# Patient Record
Sex: Female | Born: 1945 | Race: White | Hispanic: No | Smoking: Former smoker
Health system: Southern US, Community
[De-identification: ages and names within clinical notes are randomized; demographics above are authoritative.]

## PROBLEM LIST (undated history)

## (undated) DIAGNOSIS — R112 Nausea with vomiting, unspecified: Secondary | ICD-10-CM

## (undated) DIAGNOSIS — Z9889 Other specified postprocedural states: Secondary | ICD-10-CM

## (undated) DIAGNOSIS — I2510 Atherosclerotic heart disease of native coronary artery without angina pectoris: Secondary | ICD-10-CM

## (undated) DIAGNOSIS — W19XXXA Unspecified fall, initial encounter: Secondary | ICD-10-CM

## (undated) DIAGNOSIS — M1990 Unspecified osteoarthritis, unspecified site: Secondary | ICD-10-CM

## (undated) DIAGNOSIS — D219 Benign neoplasm of connective and other soft tissue, unspecified: Secondary | ICD-10-CM

## (undated) HISTORY — DX: Atherosclerotic heart disease of native coronary artery without angina pectoris: I25.10

## (undated) HISTORY — PX: TONSILLECTOMY: SUR1361

## (undated) HISTORY — PX: TUBAL LIGATION: SHX77

## (undated) HISTORY — DX: Benign neoplasm of connective and other soft tissue, unspecified: D21.9

## (undated) HISTORY — PX: ANAL FISSURE REPAIR: SHX2312

## (undated) HISTORY — DX: Unspecified fall, initial encounter: W19.XXXA

---

## 1987-11-12 HISTORY — PX: BREAST ENHANCEMENT SURGERY: SHX7

## 1993-11-11 DIAGNOSIS — D219 Benign neoplasm of connective and other soft tissue, unspecified: Secondary | ICD-10-CM

## 1993-11-11 HISTORY — DX: Benign neoplasm of connective and other soft tissue, unspecified: D21.9

## 1999-04-24 ENCOUNTER — Other Ambulatory Visit: Admission: RE | Admit: 1999-04-24 | Discharge: 1999-04-24 | Payer: Self-pay | Admitting: Gynecology

## 2000-07-16 ENCOUNTER — Other Ambulatory Visit: Admission: RE | Admit: 2000-07-16 | Discharge: 2000-07-16 | Payer: Self-pay | Admitting: Obstetrics and Gynecology

## 2001-10-20 ENCOUNTER — Other Ambulatory Visit: Admission: RE | Admit: 2001-10-20 | Discharge: 2001-10-20 | Payer: Self-pay | Admitting: Obstetrics and Gynecology

## 2002-12-01 ENCOUNTER — Other Ambulatory Visit: Admission: RE | Admit: 2002-12-01 | Discharge: 2002-12-01 | Payer: Self-pay | Admitting: Obstetrics and Gynecology

## 2004-02-27 ENCOUNTER — Other Ambulatory Visit: Admission: RE | Admit: 2004-02-27 | Discharge: 2004-02-27 | Payer: Self-pay | Admitting: Obstetrics and Gynecology

## 2004-04-19 ENCOUNTER — Ambulatory Visit (HOSPITAL_COMMUNITY): Admission: RE | Admit: 2004-04-19 | Discharge: 2004-04-19 | Payer: Self-pay | Admitting: Gastroenterology

## 2005-06-25 ENCOUNTER — Other Ambulatory Visit: Admission: RE | Admit: 2005-06-25 | Discharge: 2005-06-25 | Payer: Self-pay | Admitting: Obstetrics and Gynecology

## 2006-08-22 ENCOUNTER — Other Ambulatory Visit: Admission: RE | Admit: 2006-08-22 | Discharge: 2006-08-22 | Payer: Self-pay | Admitting: Obstetrics and Gynecology

## 2007-10-12 ENCOUNTER — Other Ambulatory Visit: Admission: RE | Admit: 2007-10-12 | Discharge: 2007-10-12 | Payer: Self-pay | Admitting: Obstetrics and Gynecology

## 2011-03-29 NOTE — Op Note (Signed)
NAME:  Glenda Hunt, Glenda Hunt                            ACCOUNT NO.:  [id number]   MEDICAL RECORD NO.:  [id number]                   PATIENT TYPE:  AMB   LOCATION:  ENDO                                 FACILITY:  [facility]   PHYSICIAN:  John C. Madilyn Fireman, M.D.                 DATE OF BIRTH:  1946/11/03   DATE OF PROCEDURE:  04/19/2004  DATE OF DISCHARGE:                                 OPERATIVE REPORT   PROCEDURE:  Colonoscopy.   INDICATIONS FOR PROCEDURE:  Screening colonoscopy.   DESCRIPTION OF PROCEDURE:  The patient was placed in the left lateral  decubitus position and placed on the pulse monitor with continuous low-flow  oxygen delivered by nasal cannula.  She was sedated with 75 mcg IV fentanyl  and 8 mg IV Versed.  The Olympus video colonoscope was inserted into the  rectum and advanced to the cecum, confirmed by transillumination of  McBurney's point and visualization of the ileocecal valve and appendiceal  orifice.  Prep was excellent.  The cecum, ascending, transverse, descending,  and sigmoid colon all appeared normal with no masses, polyps, diverticula,  or other mucosal abnormalities.  The rectum likewise appeared normal and  retroflexed view of the anus revealed no obvious internal hemorrhoids.  The  scope was then withdrawn and the patient returned to the recovery room in  stable condition.  She tolerated the procedure well and there were no  immediate complications.   IMPRESSION:  Normal colonoscopy.   PLAN:  The next colon screening by sigmoidoscopy in five years.  __________.                                               John C. Madilyn Fireman, M.D.    JCH/MEDQ  D:  04/19/2004  T:  04/19/2004  Job:  [id number]

## 2012-12-03 ENCOUNTER — Other Ambulatory Visit: Payer: Self-pay | Admitting: Orthopedic Surgery

## 2012-12-03 DIAGNOSIS — M25511 Pain in right shoulder: Secondary | ICD-10-CM

## 2012-12-06 ENCOUNTER — Ambulatory Visit
Admission: RE | Admit: 2012-12-06 | Discharge: 2012-12-06 | Disposition: A | Discharge status: Home / Self Care | Payer: Medicare Other | Source: Ambulatory Visit | Attending: Orthopedic Surgery | Admitting: Orthopedic Surgery

## 2012-12-06 DIAGNOSIS — M25511 Pain in right shoulder: Secondary | ICD-10-CM

## 2012-12-24 NOTE — H&P (Signed)
  [facility] [address]   [address] [place], [place] [address] [phone] A Division of [facility]  Loreta Ave, M.D.   Cheick Suhr A. Thurston Hole, M.D.   Burnell Blanks, M.D.   Eulas Post, M.D.   Lunette Stands, M.D Buford Dresser, M.D.  Charlsie Quest, M.D.   Estell Harpin, M.D.   Melina Fiddler, M.D. Genene Churn. Barry Dienes, PA-C            Kirstin A. Shepperson, PA-C Josh Rockwell, PA-C [place], [place]   RE: [place], [place]                                [id number]      DOB: 1946/07/09 PROGRESS NOTE: 12-03-12 Ms. Easton is a 67 year-old woman seen for follow up evaluation for recurrent right shoulder pain.  We had injected her shoulder in August, but now the pain has recurred.  Pain with overhead use and activity, relieved by rest.  Night pain as well.  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   Past medical, social and family history I have reviewed in detail.  Review of systems: All other systems are reviewed and are negative.  Current medications: Ambien and Vitamin D.  Allergies: Penicillin.  EXAMINATION: Well-developed, well-nourished white female in no acute distress.  Alert and oriented.  Height: 5?7.  Weight: 155 pounds.  Respirations: 12 and unlabored.  Examination of her right shoulder reveals forward flexion of 170 with pain and mild weakness.  Abduction of 170 with pain and mild weakness.  Internal and external rotation of 80 degrees with pain and mild weakness.  No instability.  Examination of her left shoulder reveals full range of motion without pain, weakness or instability.  Vascular exam: Pulses are 2+ and symmetric.    IMPRESSION: 1. Right shoulder possible rotator cuff tear.   2. Cervical left rhomboid radiculopathy.   PLAN: We will  obtain an MRI of the right shoulder to due to failed conservative care.  We will contact her afterwards to go over the results and determine further definitive treatment at that time.  Her cell phone is [phone] and her home number is [phone].  Cartha Rotert A. Thurston Hole, M.D.   Electronically verified by Elana Alm. Thurston Hole, M.D. RAW:jjh Cc: Dr. Maurice Small Fax: [phone] D 12-03-12 T 12-04-12

## 2012-12-25 ENCOUNTER — Ambulatory Visit (HOSPITAL_BASED_OUTPATIENT_CLINIC_OR_DEPARTMENT_OTHER): Payer: Medicare Other | Admitting: Certified Registered"

## 2012-12-25 ENCOUNTER — Encounter (HOSPITAL_BASED_OUTPATIENT_CLINIC_OR_DEPARTMENT_OTHER): Payer: Self-pay | Admitting: Certified Registered"

## 2012-12-25 ENCOUNTER — Ambulatory Visit (HOSPITAL_BASED_OUTPATIENT_CLINIC_OR_DEPARTMENT_OTHER)
Admission: RE | Admit: 2012-12-25 | Discharge: 2012-12-25 | Disposition: A | Discharge status: Home / Self Care | Payer: Medicare Other | Source: Ambulatory Visit | Attending: Orthopedic Surgery | Admitting: Orthopedic Surgery

## 2012-12-25 ENCOUNTER — Encounter (HOSPITAL_BASED_OUTPATIENT_CLINIC_OR_DEPARTMENT_OTHER): Payer: Self-pay | Admitting: *Deleted

## 2012-12-25 ENCOUNTER — Encounter (HOSPITAL_BASED_OUTPATIENT_CLINIC_OR_DEPARTMENT_OTHER)
Admission: RE | Disposition: A | Discharge status: Home / Self Care | Payer: Self-pay | Source: Ambulatory Visit | Attending: Orthopedic Surgery

## 2012-12-25 DIAGNOSIS — M25819 Other specified joint disorders, unspecified shoulder: Secondary | ICD-10-CM | POA: Insufficient documentation

## 2012-12-25 DIAGNOSIS — M719 Bursopathy, unspecified: Secondary | ICD-10-CM | POA: Insufficient documentation

## 2012-12-25 DIAGNOSIS — M7541 Impingement syndrome of right shoulder: Secondary | ICD-10-CM | POA: Diagnosis present

## 2012-12-25 DIAGNOSIS — M24119 Other articular cartilage disorders, unspecified shoulder: Secondary | ICD-10-CM | POA: Insufficient documentation

## 2012-12-25 DIAGNOSIS — Z9889 Other specified postprocedural states: Secondary | ICD-10-CM | POA: Diagnosis not present

## 2012-12-25 DIAGNOSIS — R112 Nausea with vomiting, unspecified: Secondary | ICD-10-CM | POA: Diagnosis not present

## 2012-12-25 DIAGNOSIS — M67919 Unspecified disorder of synovium and tendon, unspecified shoulder: Secondary | ICD-10-CM | POA: Insufficient documentation

## 2012-12-25 DIAGNOSIS — M19019 Primary osteoarthritis, unspecified shoulder: Secondary | ICD-10-CM | POA: Diagnosis present

## 2012-12-25 DIAGNOSIS — M75100 Unspecified rotator cuff tear or rupture of unspecified shoulder, not specified as traumatic: Secondary | ICD-10-CM | POA: Diagnosis present

## 2012-12-25 HISTORY — PX: SHOULDER ARTHROSCOPY WITH ROTATOR CUFF REPAIR: SHX5685

## 2012-12-25 HISTORY — DX: Unspecified osteoarthritis, unspecified site: M19.90

## 2012-12-25 HISTORY — DX: Nausea with vomiting, unspecified: R11.2

## 2012-12-25 HISTORY — DX: Other specified postprocedural states: Z98.890

## 2012-12-25 LAB — POCT HEMOGLOBIN-HEMACUE: Hemoglobin: 13.2 g/dL (ref 12.0–15.0)

## 2012-12-25 SURGERY — ARTHROSCOPY, SHOULDER, WITH ROTATOR CUFF REPAIR
Anesthesia: Regional | Site: Shoulder | Laterality: Right | Wound class: Clean

## 2012-12-25 MED ORDER — MIDAZOLAM HCL 2 MG/2ML IJ SOLN: 1.0000 mg | INTRAMUSCULAR | Status: DC | PRN

## 2012-12-25 MED ORDER — FENTANYL CITRATE 0.05 MG/ML IJ SOLN: 50.0000 ug | INTRAMUSCULAR | Status: DC | PRN

## 2012-12-25 MED ORDER — BUPIVACAINE-EPINEPHRINE PF 0.5-1:200000 % IJ SOLN
INTRAMUSCULAR | Status: DC | PRN
  Administered 2012-12-25: 30 mL

## 2012-12-25 MED ORDER — FENTANYL CITRATE 0.05 MG/ML IJ SOLN
50.0000 ug | INTRAMUSCULAR | Status: DC | PRN
  Administered 2012-12-25: 100 ug via INTRAVENOUS

## 2012-12-25 MED ORDER — VANCOMYCIN HCL 1000 MG IV SOLR
1000.0000 mg | INTRAVENOUS | Status: DC | PRN
  Administered 2012-12-25: 1000 mg via INTRAVENOUS

## 2012-12-25 MED ORDER — PROPOFOL 10 MG/ML IV BOLUS
INTRAVENOUS | Status: DC | PRN
  Administered 2012-12-25: 100 mg via INTRAVENOUS

## 2012-12-25 MED ORDER — SUCCINYLCHOLINE CHLORIDE 20 MG/ML IJ SOLN
INTRAMUSCULAR | Status: DC | PRN
  Administered 2012-12-25: 80 mg via INTRAVENOUS

## 2012-12-25 MED ORDER — DIAZEPAM 2 MG PO TABS: 1.0000 mg | ORAL_TABLET | Freq: Three times a day (TID) | ORAL | Status: DC | PRN

## 2012-12-25 MED ORDER — PROMETHAZINE HCL 25 MG/ML IJ SOLN
6.2500 mg | INTRAMUSCULAR | Status: AC | PRN
  Administered 2012-12-25 (×2): 6.25 mg via INTRAVENOUS

## 2012-12-25 MED ORDER — ONDANSETRON 4 MG PO TBDP: 4.0000 mg | ORAL_TABLET | Freq: Three times a day (TID) | ORAL | Status: DC | PRN

## 2012-12-25 MED ORDER — TRAMADOL HCL 50 MG PO TABS: 50.0000 mg | ORAL_TABLET | Freq: Four times a day (QID) | ORAL | Status: DC | PRN

## 2012-12-25 MED ORDER — EPINEPHRINE HCL 1 MG/ML IJ SOLN
INTRAMUSCULAR | Status: DC | PRN
  Administered 2012-12-25 (×2)

## 2012-12-25 MED ORDER — LACTATED RINGERS IV SOLN
INTRAVENOUS | Status: DC
  Administered 2012-12-25 (×3): via INTRAVENOUS

## 2012-12-25 MED ORDER — MIDAZOLAM HCL 2 MG/2ML IJ SOLN
1.0000 mg | INTRAMUSCULAR | Status: DC | PRN
  Administered 2012-12-25: 2 mg via INTRAVENOUS

## 2012-12-25 MED ORDER — OXYCODONE HCL 5 MG PO TABS: 5.0000 mg | ORAL_TABLET | Freq: Once | ORAL | Status: DC | PRN

## 2012-12-25 MED ORDER — HYDROMORPHONE HCL PF 1 MG/ML IJ SOLN: 0.2500 mg | INTRAMUSCULAR | Status: DC | PRN

## 2012-12-25 MED ORDER — DEXAMETHASONE SODIUM PHOSPHATE 4 MG/ML IJ SOLN: INTRAMUSCULAR | Status: DC | PRN

## 2012-12-25 MED ORDER — OXYCODONE HCL 5 MG/5ML PO SOLN: 5.0000 mg | Freq: Once | ORAL | Status: DC | PRN

## 2012-12-25 MED ORDER — DEXAMETHASONE SODIUM PHOSPHATE 4 MG/ML IJ SOLN
INTRAMUSCULAR | Status: DC | PRN
  Administered 2012-12-25: 10 mg via INTRAVENOUS

## 2012-12-25 SURGICAL SUPPLY — 77 items
APL SKNCLS STERI-STRIP NONHPOA (GAUZE/BANDAGES/DRESSINGS)
BENZOIN TINCTURE PRP APPL 2/3 (GAUZE/BANDAGES/DRESSINGS) IMPLANT
BLADE CUDA 5.5 (BLADE) IMPLANT
BLADE CUTTER GATOR 3.5 (BLADE) ×2 IMPLANT
BLADE GREAT WHITE 4.2 (BLADE) IMPLANT
BLADE SURG 15 STRL LF DISP TIS (BLADE) IMPLANT
BLADE SURG 15 STRL SS (BLADE)
BLADE SURG ROTATE 9660 (MISCELLANEOUS) IMPLANT
BNDG COHESIVE 4X5 TAN STRL (GAUZE/BANDAGES/DRESSINGS) ×1 IMPLANT
BUR OVAL 6.0 (BURR) ×2 IMPLANT
CANISTER OMNI JUG 16 LITER (MISCELLANEOUS) ×1 IMPLANT
CANISTER SUCTION 2500CC (MISCELLANEOUS) IMPLANT
CANNULA TWIST IN 8.25X7CM (CANNULA) ×1 IMPLANT
DECANTER SPIKE VIAL GLASS SM (MISCELLANEOUS) IMPLANT
DRAPE SHOULDER BEACH CHAIR (DRAPES) ×2 IMPLANT
DRAPE U-SHAPE 47X51 STRL (DRAPES) ×4 IMPLANT
DRAPE UTILITY W/TAPE 26X15 (DRAPES) ×1 IMPLANT
DRSG PAD ABDOMINAL 8X10 ST (GAUZE/BANDAGES/DRESSINGS) ×2 IMPLANT
DURAPREP 26ML APPLICATOR (WOUND CARE) ×2 IMPLANT
ELECT REM PT RETURN 9FT ADLT (ELECTROSURGICAL)
ELECTRODE REM PT RTRN 9FT ADLT (ELECTROSURGICAL) ×1 IMPLANT
GAUZE XEROFORM 1X8 LF (GAUZE/BANDAGES/DRESSINGS) ×2 IMPLANT
GLOVE BIO SURGEON STRL SZ 6.5 (GLOVE) ×2 IMPLANT
GLOVE BIO SURGEON STRL SZ7 (GLOVE) IMPLANT
GLOVE BIOGEL PI IND STRL 7.0 (GLOVE) ×2 IMPLANT
GLOVE BIOGEL PI IND STRL 7.5 (GLOVE) ×1 IMPLANT
GLOVE BIOGEL PI INDICATOR 7.0 (GLOVE) ×2
GLOVE BIOGEL PI INDICATOR 7.5 (GLOVE) ×1
GLOVE SS BIOGEL STRL SZ 7.5 (GLOVE) ×1 IMPLANT
GLOVE SUPERSENSE BIOGEL SZ 7.5 (GLOVE) ×1
GOWN PREVENTION PLUS XLARGE (GOWN DISPOSABLE) ×3 IMPLANT
GOWN PREVENTION PLUS XXLARGE (GOWN DISPOSABLE) ×1 IMPLANT
NDL 1/2 CIR CATGUT .05X1.09 (NEEDLE) IMPLANT
NDL SAFETY ECLIPSE 18X1.5 (NEEDLE) ×1 IMPLANT
NDL SCORPION MULTI FIRE (NEEDLE) IMPLANT
NDL SUT 6 .5 CRC .975X.05 MAYO (NEEDLE) IMPLANT
NEEDLE 1/2 CIR CATGUT .05X1.09 (NEEDLE) IMPLANT
NEEDLE HYPO 18GX1.5 SHARP (NEEDLE) ×2
NEEDLE MAYO TAPER (NEEDLE)
NEEDLE SCORPION MULTI FIRE (NEEDLE) ×2 IMPLANT
PACK ARTHROSCOPY DSU (CUSTOM PROCEDURE TRAY) ×2 IMPLANT
PACK BASIN DAY SURGERY FS (CUSTOM PROCEDURE TRAY) ×2 IMPLANT
PAD ALCOHOL SWAB (MISCELLANEOUS) ×2 IMPLANT
PENCIL BUTTON HOLSTER BLD 10FT (ELECTRODE) IMPLANT
SET ARTHROSCOPY TUBING (MISCELLANEOUS) ×2
SET ARTHROSCOPY TUBING LN (MISCELLANEOUS) ×1 IMPLANT
SHEET MEDIUM DRAPE 40X70 STRL (DRAPES) IMPLANT
SLEEVE SCD COMPRESS KNEE MED (MISCELLANEOUS) ×2 IMPLANT
SLING ARM FOAM STRAP LRG (SOFTGOODS) IMPLANT
SLING ARM FOAM STRAP MED (SOFTGOODS) IMPLANT
SLING ARM FOAM STRAP XLG (SOFTGOODS) IMPLANT
SLING ARM IMMOBILIZER MED (SOFTGOODS) IMPLANT
SLING ULTRA III MED (ORTHOPEDIC SUPPLIES) IMPLANT
SPONGE GAUZE 4X4 12PLY (GAUZE/BANDAGES/DRESSINGS) ×2 IMPLANT
SPONGE LAP 4X18 X RAY DECT (DISPOSABLE) IMPLANT
STRIP CLOSURE SKIN 1/2X4 (GAUZE/BANDAGES/DRESSINGS) IMPLANT
SUCTION FRAZIER TIP 10 FR DISP (SUCTIONS) IMPLANT
SUT ETHILON 3 0 PS 1 (SUTURE) ×2 IMPLANT
SUT FIBERWIRE #2 38 T-5 BLUE (SUTURE) ×2
SUT PDS AB 2-0 CT2 27 (SUTURE) IMPLANT
SUT PROLENE 3 0 PS 2 (SUTURE) IMPLANT
SUT TIGER TAPE 7 IN WHITE (SUTURE) IMPLANT
SUT VIC AB 0 SH 27 (SUTURE) IMPLANT
SUT VIC AB 2-0 PS2 27 (SUTURE) IMPLANT
SUT VIC AB 2-0 SH 27 (SUTURE)
SUT VIC AB 2-0 SH 27XBRD (SUTURE) IMPLANT
SUTURE FIBERWR #2 38 T-5 BLUE (SUTURE) IMPLANT
SYR 20CC LL (SYRINGE) IMPLANT
SYR 5ML LL (SYRINGE) ×1 IMPLANT
SYR BULB 3OZ (MISCELLANEOUS) IMPLANT
TAPE FIBER 2MM 7IN #2 BLUE (SUTURE) IMPLANT
TAPE HYPAFIX 6X30 (GAUZE/BANDAGES/DRESSINGS) IMPLANT
TAPE STRIPS DRAPE STRL (GAUZE/BANDAGES/DRESSINGS) ×2 IMPLANT
TOWEL OR 17X24 6PK STRL BLUE (TOWEL DISPOSABLE) ×2 IMPLANT
TUBE CONNECTING 20X1/4 (TUBING) IMPLANT
WAND STAR VAC 90 (SURGICAL WAND) ×2 IMPLANT
WATER STERILE IRR 1000ML POUR (IV SOLUTION) ×2 IMPLANT

## 2012-12-25 NOTE — OR Nursing (Signed)
Glenda Hunt Immobilizer applied post-op, sent from Dr. Sherene Sires office

## 2012-12-25 NOTE — Anesthesia Procedure Notes (Addendum)
Anesthesia Regional Block:  Interscalene brachial plexus block  Pre-Anesthetic Checklist: ,, timeout performed, Correct Patient, Correct Site, Correct Laterality, Correct Procedure, Correct Position, site marked, Risks and benefits discussed, pre-op evaluation,  At surgeon's request and post-op pain management  Laterality: Right  Prep: Maximum Sterile Barrier Precautions used and chloraprep       Needles:  Injection technique: Single-shot  Needle Type: Echogenic Stimulator Needle      Needle Gauge: 22 and 22 G    Additional Needles:  Procedures: ultrasound guided (picture in chart) and nerve stimulator Interscalene brachial plexus block  Nerve Stimulator or Paresthesia:  Response: Biceps response, 0.4 mA,   Additional Responses:   Narrative:  Start time: 12/25/2012 9:58 AM End time: 12/25/2012 10:03 AM Injection made incrementally with aspirations every 5 mL. Anesthesiologist: Sampson Goon, MD  Additional Notes: 2% Lidocaine skin wheel.   Interscalene brachial plexus block Procedure Name: Intubation Date/Time: 12/25/2012 10:46 AM Performed by: Verlan Friends Pre-anesthesia Checklist: Patient identified, Emergency Drugs available, Suction available, Patient being monitored and Timeout performed Patient Re-evaluated:Patient Re-evaluated prior to inductionOxygen Delivery Method: Circle System Utilized Preoxygenation: Pre-oxygenation with 100% oxygen Intubation Type: IV induction Ventilation: Mask ventilation without difficulty Laryngoscope Size: Miller and 3 Grade View: Grade I Tube type: Oral Tube size: 7.0 mm Number of attempts: 1 Airway Equipment and Method: stylet and oral airway Placement Confirmation: ETT inserted through vocal cords under direct vision,  positive ETCO2 and breath sounds checked- equal and bilateral Secured at: 20 cm Tube secured with: Tape Dental Injury: Teeth and Oropharynx as per pre-operative assessment

## 2012-12-25 NOTE — Interval H&P Note (Signed)
History and Physical Interval Note:  12/25/2012 10:30 AM  Glenda Hunt  has presented today for surgery, with the diagnosis of POSSIBLE ROTATOR CUFF TEAR  The various methods of treatment have been discussed with the patient and family. After consideration of risks, benefits and other options for treatment, the patient has consented to  Procedure(s): SHOULDER ARTHROSCOPY WITH POSSIBLE ROTATOR CUFF REPAIR (Right) as a surgical intervention .  The patient's history has been reviewed, patient examined, no change in status, stable for surgery.  I have reviewed the patient's chart and labs.  Questions were answered to the patient's satisfaction.     Salvatore Marvel A

## 2012-12-25 NOTE — Anesthesia Postprocedure Evaluation (Signed)
  Anesthesia Post-op Note  Patient: Glenda Hunt  Procedure(s) Performed: Procedure(s): SHOULDER ARTHROSCOPY, DISTAL CLAVICLE RESECTION, SUBACROMIAL DECOMPRESSION, ROTATOR CUFF REPAIR, DEBRIDEMENT OF LABRIUM (Right)  Patient Location: PACU  Anesthesia Type:GA combined with regional for post-op pain  Level of Consciousness: awake and alert   Airway and Oxygen Therapy: Patient Spontanous Breathing  Post-op Pain: none  Post-op Assessment: Post-op Vital signs reviewed, Patient's Cardiovascular Status Stable, Respiratory Function Stable and Patent Airway  Post-op Vital Signs: Reviewed and stable  Complications: No apparent anesthesia complications

## 2012-12-25 NOTE — Progress Notes (Signed)
Assisted Dr. Fitzgerald with right, ultrasound guided, interscalene  block. Side rails up, monitors on throughout procedure. See vital signs in flow sheet. Tolerated Procedure well. 

## 2012-12-25 NOTE — Anesthesia Preprocedure Evaluation (Signed)
Anesthesia Evaluation  Patient identified by MRN, date of birth, ID band Patient awake    Reviewed: Allergy & Precautions, H&P , NPO status , Patient's Chart, lab work & pertinent test results  History of Anesthesia Complications (+) PONV  Airway Mallampati: II TM Distance: >3 FB Neck ROM: Full    Dental no notable dental hx. (+) Teeth Intact and Dental Advisory Given   Pulmonary neg pulmonary ROS,  breath sounds clear to auscultation  Pulmonary exam normal       Cardiovascular negative cardio ROS  Rhythm:Regular Rate:Normal     Neuro/Psych negative neurological ROS  negative psych ROS   GI/Hepatic negative GI ROS, Neg liver ROS,   Endo/Other  negative endocrine ROS  Renal/GU negative Renal ROS  negative genitourinary   Musculoskeletal   Abdominal   Peds  Hematology negative hematology ROS (+)   Anesthesia Other Findings   Reproductive/Obstetrics negative OB ROS                           Anesthesia Physical Anesthesia Plan  ASA: I  Anesthesia Plan: General and Regional   Post-op Pain Management:    Induction: Intravenous  Airway Management Planned: Oral ETT  Additional Equipment:   Intra-op Plan:   Post-operative Plan: Extubation in OR  Informed Consent: I have reviewed the patients History and Physical, chart, labs and discussed the procedure including the risks, benefits and alternatives for the proposed anesthesia with the patient or authorized representative who has indicated his/her understanding and acceptance.   Dental advisory given  Plan Discussed with: CRNA  Anesthesia Plan Comments:         Anesthesia Quick Evaluation  

## 2012-12-25 NOTE — Transfer of Care (Signed)
Immediate Anesthesia Transfer of Care Note  Patient: Glenda Hunt  Procedure(s) Performed: Procedure(s): SHOULDER ARTHROSCOPY, DISTAL CLAVICLE RESECTION, SUBACROMIAL DECOMPRESSION, ROTATOR CUFF REPAIR, DEBRIDEMENT OF LABRIUM (Right)  Patient Location: PACU  Anesthesia Type:GA combined with regional for post-op pain  Level of Consciousness: awake, alert , oriented and patient cooperative  Airway & Oxygen Therapy: Patient Spontanous Breathing and Patient connected to face mask oxygen  Post-op Assessment: Report given to PACU RN and Post -op Vital signs reviewed and stable  Post vital signs: Reviewed and stable  Complications: No apparent anesthesia complications

## 2012-12-28 ENCOUNTER — Encounter (HOSPITAL_BASED_OUTPATIENT_CLINIC_OR_DEPARTMENT_OTHER): Payer: Self-pay | Admitting: Orthopedic Surgery

## 2012-12-28 NOTE — Op Note (Signed)
NAMEMARKEISHA, Glenda Hunt                  ACCOUNT NO.:  [id number]  MEDICAL RECORD NO.:  [id number]  LOCATION:                                 FACILITY:  PHYSICIAN:  Elana Alm. Thurston Hole, M.D. DATE OF BIRTH:  Sep 01, 1946  DATE OF PROCEDURE:  12/25/2012 DATE OF DISCHARGE:                              OPERATIVE REPORT   PREOPERATIVE DIAGNOSES: 1. Right shoulder rotator cuff tear. 2. Right shoulder partial labrum tear. 3. Right shoulder impingement. 4. Right shoulder acromioclavicular joint, degenerative joint disease,     and spurring.  POSTOPERATIVE DIAGNOSES: 1. Right shoulder rotator cuff tear. 2. Right shoulder partial labrum tear. 3. Right shoulder impingement. 4. Right shoulder acromioclavicular joint, degenerative joint disease,     and spurring.  PROCEDURE: 1. Right shoulder examination under anesthesia, followed by     arthroscopically-assisted rotator cuff repair using fiber wire side-     to-side suture repair. 2. Right shoulder partial labrum tear and debridement. 3. Right shoulder subacromial decompression. 4. Right shoulder distal clavicle excision.  SURGEON:  Elana Alm. Thurston Hole, M.D.  ASSISTANT:  Julien Girt, PA-C  ANESTHESIA:  General.  OPERATIVE TIME:  1 hour.  COMPLICATIONS:  None.  INDICATION FOR PROCEDURE:  Glenda Hunt is a 67 year old woman who has had 9- 12 months of increasing right shoulder pain with exam and MRI documenting a rotator cuff tear and partial labrum tear with impingement and AC joint arthropathy.  She has failed conservative care and is now to undergo arthroscopy and repair.  DESCRIPTION:  Glenda Hunt was brought to the operating room on December 25, 2012, after an interscalene block was placed only by anesthesia.  She was placed on the operative table in supine position.  She received preoperative antibiotics for prophylaxis.  After being placed under general anesthesia, her right shoulder was examined.  She had full range of motion and  her shoulder was stable to ligamentous exam.  She was then placed in beach-chair position, and her shoulder and arm was prepped using sterile DuraPrep and draped using sterile technique.  A time-out procedure was called and the correct right shoulder identified. Initially, through a posterior arthroscopic portal, the arthroscope with a pump attached was placed into an anterior portal and arthroscopic probe was placed.  On initial inspection, the articular cartilage and the glenohumeral joint was intact.  She had partial tearing of the anterior, superior, and posterior labrum 25% which was debrided.  The anterior-inferior labrum and anterior-inferior glenohumeral ligament complex was intact.  Biceps tendon anchor and biceps tendon was intact. The rotator cuff on the articular surface showed no definite tearing. The inferior capsular recess free of pathology.  Subacromial space was entered and a lateral arthroscopic portal was made.  Moderately thickened bursitis was resected.  Impingement was noted and a subacromial decompression was carried out removing 6-8 mm of the undersurface of the anterior, anterolateral, anteromedial acromion and CA ligament release carried out as well.  The [facility] joint showed significant spurring and degenerative changes and impingement and thus distal clavicle 8 mm was resected with a 6 mm bur.  The rotator cuff showed a split longitudinal tear of the supraspinatus, infraspinatus border that was  not detached from the bone, but was layered and split on the bursal surface that was amenable to repair and using a side-to-side repair with a #2 FiberWire, the suture was placed and tied down arthroscopically with firm and tight fixation.  This completely repaired this split longitudinal tear.  After this was done, the shoulder could be brought through full range of motion with no impingement on the repair.  At this point, she felt that all pathology had been satisfactorily  addressed.  The instruments were removed.  Portals were closed with 3-0 nylon suture.  Sterile dressings and a sling applied, and then the patient awakened and taken to recovery room in stable condition.  FOLLOWUP CARE:  Glenda Hunt will be followed as an outpatient on tramadol and Zofran with an abduction sling.  Seen back in the office in a week for sutures out and followup.     Ante Arredondo A. Thurston Hole, M.D.     RAW/MEDQ  D:  12/25/2012  T:  12/25/2012  Job:  [id number]

## 2013-02-03 ENCOUNTER — Other Ambulatory Visit: Payer: Self-pay | Admitting: Orthopedic Surgery

## 2013-02-03 ENCOUNTER — Telehealth: Payer: Self-pay | Admitting: Obstetrics and Gynecology

## 2013-02-03 DIAGNOSIS — G4700 Insomnia, unspecified: Secondary | ICD-10-CM

## 2013-02-03 MED ORDER — ZOLPIDEM TARTRATE 10 MG PO TABS: 10.0000 mg | ORAL_TABLET | Freq: Every evening | ORAL | Status: DC | PRN

## 2013-02-03 NOTE — Telephone Encounter (Signed)
Ok to refill Ambien 10 mg one po at hs prn insomnia # 20 no refills

## 2013-02-03 NOTE — Telephone Encounter (Signed)
Call to [facility] in [facility] for Ambien 10 mg PO QHS prn insomnia. #20/0 RF.

## 2013-02-03 NOTE — Telephone Encounter (Signed)
LM on pt VM confirming name that Rx for Ambien [facility] [place]. aa

## 2013-02-03 NOTE — Telephone Encounter (Signed)
This med is listed in pt chart as a "long term med". Do you want to refill or have pt contact ortho surgeon about refill? aa

## 2013-02-03 NOTE — Telephone Encounter (Signed)
Pt called in and needs a refill for Ambien. Had surgery on shoulder and is having a difficult time sleeping. Pt said she is taking it almost every night. Please call [phone]. [facility] [place]. The pharmacy phone number is [phone].

## 2013-03-24 ENCOUNTER — Encounter: Payer: Self-pay | Admitting: Obstetrics and Gynecology

## 2013-07-16 ENCOUNTER — Ambulatory Visit: Payer: Self-pay | Admitting: Obstetrics and Gynecology

## 2013-08-13 ENCOUNTER — Ambulatory Visit: Payer: Self-pay | Admitting: Gynecology

## 2013-08-13 ENCOUNTER — Ambulatory Visit: Payer: Self-pay | Admitting: Obstetrics and Gynecology

## 2013-09-01 ENCOUNTER — Encounter: Payer: Self-pay | Admitting: Obstetrics and Gynecology

## 2013-09-03 ENCOUNTER — Encounter: Payer: Self-pay | Admitting: Gynecology

## 2013-09-03 ENCOUNTER — Ambulatory Visit (INDEPENDENT_AMBULATORY_CARE_PROVIDER_SITE_OTHER): Payer: Medicare Other | Admitting: Gynecology

## 2013-09-03 VITALS — BP 106/74 | HR 62 | Resp 16 | Ht 66.0 in | Wt 155.0 lb

## 2013-09-03 DIAGNOSIS — R8290 Unspecified abnormal findings in urine: Secondary | ICD-10-CM

## 2013-09-03 DIAGNOSIS — Z01419 Encounter for gynecological examination (general) (routine) without abnormal findings: Secondary | ICD-10-CM

## 2013-09-03 DIAGNOSIS — R82998 Other abnormal findings in urine: Secondary | ICD-10-CM

## 2013-09-03 LAB — POCT URINALYSIS DIPSTICK
Leukocytes, UA: NEGATIVE
pH, UA: 5

## 2013-09-03 NOTE — Patient Instructions (Signed)

## 2013-09-03 NOTE — Progress Notes (Signed)
67 y.o. Divorced Caucasian female   G1P1001 here for annual exam. Pt reports menses are absent.  She does not report hot flashes, does not have night sweats, does not have vaginal dryness.  She is not using lubricants.  She does not report post-menopasual bleeding.  Pt reports change in odor of urine but denies any recent change in medication or diet.  Pt reports fatigue, bloating and vaginal pain.  Pain is sharp closer to labia  No LMP recorded. Patient is postmenopausal.          Sexually active: no  The current method of family planning is post menopausal status.    Exercising: yes  walk and work out with weights when she can Last pap: 04/13/2010 Negative  Abnormal PAP: yes, long time ago  Mammogram: 09/22/12 Bi Rads 1 Negative BSE: yes Colonoscopy: 2005 DEXA: 05/16/2010 Alcohol: 5 drinks/wk (glass of wine) Tobacco: no  Hgb: PCP ; Urine: Trace Blood, Trace Protein   Health Maintenance  Topic Date Due  . Tetanus/tdap  05/22/1966  . Mammogram  05/22/1997  . Colonoscopy  05/22/1997  . Zostavax  05/23/2007  . Pneumococcal Polysaccharide Vaccine Age 46 And Over  05/22/2012  . Influenza Vaccine  06/11/2013    Family History  Problem Relation Age of Onset  . Osteoporosis Mother     Patient Active Problem List   Diagnosis Date Noted  . Rotator cuff tear, non-traumatic 12/25/2012  . Impingement syndrome of right shoulder 12/25/2012  . 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 Post-operative nausea and vomiting 12/25/2012    Past Medical History  Diagnosis Date  . PONV (postoperative nausea and vomiting)   . Arthritis   . Fibroid 1995    Past Surgical History  Procedure Laterality Date  . Tonsillectomy    . Breast enhancement surgery    . Anal fissure repair    . Shoulder arthroscopy with rotator cuff repair Right 12/25/2012    Procedure: SHOULDER ARTHROSCOPY, DISTAL CLAVICLE RESECTION, SUBACROMIAL DECOMPRESSION, ROTATOR CUFF REPAIR, DEBRIDEMENT OF LABRIUM;  Surgeon:  Nilda Simmer, MD;  Location: [facility];  Service: Orthopedics;  Laterality: Right;  . Tubal ligation    . 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 Ascorbic Acid (VITAMIN C) 1000 MG tablet Take 2,000 mg by mouth daily.      . calcium-vitamin D (OSCAL WITH D) 500-200 MG-UNIT per tablet Take 1 tablet by mouth daily.      . diazepam (VALIUM) 2 MG tablet Take 0.5-1 tablets (1-2 mg total) by mouth every 8 (eight) hours as needed (for muscle spasm or anxiety).  30 tablet  0  . IBUPROFEN PO Take by mouth.      Marland Kitchen MAGNESIUM PO Take by mouth.      . Multiple Vitamin (MULTIVITAMIN WITH MINERALS) TABS Take 1 tablet by mouth daily.      . ondansetron (ZOFRAN ODT) 4 MG disintegrating tablet Take 1 tablet (4 mg total) by mouth every 8 (eight) hours as needed for nausea.  20 tablet  0  . POTASSIUM PO Take by mouth.      . traMADol (ULTRAM) 50 MG tablet Take 1 tablet (50 mg total) by mouth every 6 (six) hours as needed for pain.  30 tablet  0  . Vitamin D, Ergocalciferol, (DRISDOL) 50000 UNITS CAPS Take 50,000 Units by mouth.      Marland Kitchen  zolpidem (AMBIEN) 10 MG tablet Take 1 tablet (10 mg total) by mouth at bedtime as needed for sleep.  20 tablet  0   No current facility-administered medications for this visit.    ROS: Pertinent items are noted in HPI.  Exam:    Ht 5\' 6"  (1.676 m)  Wt 155 lb (70.308 kg)  BMI 25.03 kg/m2 Weight change: @WEIGHTCHANGE @ Last 3 height recordings:  Ht Readings from Last 3 Encounters:  09/03/13 5\' 6"  (1.676 m)  12/25/12 5\' 7"  (1.702 m)  12/25/12 5\' 7"  (1.702 m)   General appearance: alert, cooperative and appears stated age Head: Normocephalic, without obvious abnormality, atraumatic Neck: no adenopathy, no carotid bruit, no JVD, supple, symmetrical, trachea midline and thyroid not enlarged, symmetric, no tenderness/mass/nodules Lungs:  clear to auscultation bilaterally Breasts: normal appearance, no masses or tenderness, bilateral implants with contracture Heart: regular rate and rhythm, S1, S2 normal, no murmur, click, rub or gallop Abdomen: soft, non-tender; bowel sounds normal; no masses,  no organomegaly Extremities: extremities normal, atraumatic, no cyanosis or edema Skin: Skin color, texture, turgor normal. No rashes or lesions Lymph nodes: Cervical, supraclavicular, and axillary nodes normal. no inguinal nodes palpated Neurologic: Grossly normal   Pelvic: External genitalia:  no lesions              Urethra: normal appearing urethra with no masses, tenderness or lesions              Bartholins and Skenes: normal                 Vagina: atrophic, petechiae, no lesions              Cervix: normal appearance and atrophic              Pap taken: no        Bimanual Exam:  Uterus:  atrophic                                      Adnexa:    no masses                                      Rectovaginal: Confirms                                      Anus:  normal sphincter tone, no lesions  A: well woman      P: mammogram pap smear not done counseled on breast self exam, mammography screening, menopause, adequate intake of calcium and vitamin D, diet and exercise return annually or prn Discussed PAP guideline changes, importance of weight bearing exercises, calcium, vit D and balanced diet.  An After Visit Summary was printed and given to the patient.

## 2013-09-16 ENCOUNTER — Other Ambulatory Visit: Payer: Self-pay

## 2014-03-16 ENCOUNTER — Encounter: Payer: Self-pay | Admitting: Podiatrist

## 2014-03-16 ENCOUNTER — Ambulatory Visit (INDEPENDENT_AMBULATORY_CARE_PROVIDER_SITE_OTHER): Payer: Medicare Other | Admitting: Podiatrist

## 2014-03-16 ENCOUNTER — Ambulatory Visit (INDEPENDENT_AMBULATORY_CARE_PROVIDER_SITE_OTHER): Payer: Medicare Other

## 2014-03-16 DIAGNOSIS — M21619 Bunion of unspecified foot: Secondary | ICD-10-CM

## 2014-03-16 MED ORDER — TAVABOROLE 5 % EX SOLN: 1.0000 [drp] | CUTANEOUS | Status: DC

## 2014-03-16 NOTE — Progress Notes (Signed)
   Subjective:    Patient ID: Glenda Hunt, female    DOB: [birth year], 68 y.o.   MRN: [id number]  HPI PT STATED BOTH BUNION BEEN HURTING OFF AND ON FOR 2 YEARS. THE FEET ARE GETTING WORSE. THE AGGRAVATED BY WALKING AND WEARING CLOSED SHOES AND TRIED NO TREATMENT.  ALSO, RT FOOT GREAT AND 2ND TOENAIL HAVE DISCOLORATION AND THICK FOR 6 WEEKS. THE TOENAILS IS GROWING BUT BETTER. TRIED NO TREATMENT.    Review of Systems  All other systems reviewed and are negative.      Objective:   Physical Exam Patient is awake, alert, and oriented x 3.  In no acute distress.  Neurovascular status is intact with palpable pedal pulses at 2/4 DP and PT bilateral and capillary refill time within normal limits. Neurological sensation is also intact bilaterally both epicritically and protectively. Dermatological exam reveals skin color, turger and texture as normal. No open lesions present.  Digital nails 1 and 2 of the right foot are discolored, dystrophic and clinically mycotic. usculoskeletal examination reveals bunion deformity left greater than right. Musculature intact with dorsiflexion, plantarflexion, inversion, eversion.      Assessment & Plan:  Bunion bilateral, onychomycosis digits one and 2 right   Plan: Discussed treatment options and alternatives for onychomycosis. Discussed orthotics in surgery for the bunions. At this time the patient would like to be conservative with her care. Discussed the details of the bunionectomy and also discussed that she would likely only require shaving procedure and this would allow her to have both done at the same time and allow her to get back to normal shoes quicker. She'll consider the procedure and call if she would like to proceed. She would need a preoperative consult to sign paperwork.

## 2014-03-16 NOTE — Patient Instructions (Signed)
Bunionectomy A bunionectomy is surgery to remove a bunion. A bunion is an enlargement of the joint at the base of the big toe. It is made up of bone and soft tissue on the inside part of the joint. Over time, a painful lump appears on the inside of the joint. The big toe begins to point inward toward the second toe. New bone growth can occur and a bone spur may form. The pain eventually causes difficulty walking. A bunion usually results from inflammation caused by the irritation of poorly fitting shoes. It often begins later in life. A bunionectomy is performed when nonsurgical treatment no longer works. When surgery is needed, the extent of the procedure will depend on the degree of deformity of the foot. Your surgeon will discuss with you the different procedures and what will work best for you depending on your age and health. LET YOUR CAREGIVER KNOW ABOUT:   Previous problems with anesthetics or medicines used to numb the skin.  Allergies to dyes, iodine, foods, and/or latex.  Medicines taken including herbs, eye drops, prescription medicines (especially medicines used to "thin the blood"), aspirin and other over-the-counter medicines, and steroids (by mouth or as a cream).  History of bleeding or blood problems.  Possibility of pregnancy, if this applies.  History of blood clots in your legs and/or lungs .  Previous surgery.  Other important health problems. RISKS AND COMPLICATIONS   Infection.  Pain.  Nerve damage.  Possibility that the bunion will recur. BEFORE THE PROCEDURE  You should be present 60 minutes prior to your procedure or as directed.  PROCEDURE  Surgery is often done so that you can go home the same day (outpatient). It may be done in a hospital or in an outpatient surgical center. An anesthetic will be used to help you sleep during the procedure. Sometimes, a spinal anesthetic is used to make you numb below the waist. A cut (incision) is made over the swollen  area at the first joint of the big toe. The enlarged lump will be removed. If there is a need to reposition the bones of the big toe, this may require more than 1 incision. The bone itself may need to be cut. Screws and wires may be used in the repair. These can be removed at a later date. In severe cases, the entire joint may need to be removed and a joint replacement inserted. When done, the incision is closed with stitches (sutures). Skin adhesive strips may be added for reinforcement. They help hold the incision closed.  AFTER THE PROCEDURE  Compression bandages (dressings) are then wrapped around the wound. This helps to keep the foot in alignment and reduce swelling. Your foot will be monitored for bleeding and swelling. You will need to stay for a few hours in the recovery area before being discharged. This allows time for the anesthesia to wear off. You will be discharged home when you are awake, stable, and doing well. HOME CARE INSTRUCTIONS   You can expect to return to normal activities within 6 to 8 weeks after surgery. The foot is at increased risk for swelling for several months. When you can expect to bear weight on the operated foot will depend on the extent of your surgery. The milder the deformity, the less tissue is removed and the sooner the return to normal activity level. During the recovery period, a special shoe, boot, or cast may be worn to accommodate the surgical bandage and to help provide stability   to the foot.  Once you are home, an ice pack applied to the operative site may help with discomfort and keep swelling down. Stop using the ice if it causes discomfort.  Keep your feet raised (elevated) when possible to lessen swelling.  If you have an elastic bandage on your foot and you have numbness, tingling, or your foot becomes cold and blue, adjust the bandage to make it comfortable.  Change dressings as directed.  Keep the wound dry and clean. The wound may be washed  gently with soap and water. Gently blot dry without rubbing. Do not take baths or use swimming pools or hot tubs for 10 days, or as instructed by your caregiver.  Only take over-the-counter or prescription medicines for pain, discomfort, or fever as directed by your caregiver.  You may continue a normal diet as directed.  For activity, use crutches with no weight bearing or your orthopedic shoe as directed. Continue to use crutches or a cane as directed until you can stand without causing pain. SEEK MEDICAL CARE IF:   You have redness, swelling, bruising, or increasing pain in the wound.  There is pus coming from the wound.  You have drainage from a wound lasting longer than 1 day.  You have an oral temperature above 102 F (38.9 C).  You notice a bad smell coming from the wound or dressing.  The wound breaks open after sutures have been removed.  You develop dizzy episodes or fainting while standing.  You have persistent nausea or vomiting.  Your toes become cold.  Pain is not relieved with medicines. SEEK IMMEDIATE MEDICAL CARE IF:   You develop a rash.  You have difficulty breathing.  You develop any reaction or side effects to medicines given.  Your toes are numb or blue, or you have severe pain. MAKE SURE YOU:   Understand these instructions.  Will watch your condition.  Will get help right away if you are not doing well or get worse. Document Released: 10/11/2005 Document Revised: 01/20/2012 Document Reviewed: 11/16/2007 ExitCare Patient Information 2014 [employer].  

## 2014-08-24 ENCOUNTER — Other Ambulatory Visit (HOSPITAL_COMMUNITY): Payer: Self-pay | Admitting: Gastroenterology

## 2014-08-24 ENCOUNTER — Ambulatory Visit (HOSPITAL_COMMUNITY)
Admission: RE | Admit: 2014-08-24 | Discharge: 2014-08-24 | Disposition: A | Discharge status: Home / Self Care | Payer: Medicare Other | Source: Ambulatory Visit | Attending: Gastroenterology | Admitting: Gastroenterology

## 2014-08-24 ENCOUNTER — Ambulatory Visit (HOSPITAL_COMMUNITY): Payer: Medicare Other

## 2014-08-24 ENCOUNTER — Telehealth: Payer: Self-pay | Admitting: Gynecology

## 2014-08-24 DIAGNOSIS — Z1211 Encounter for screening for malignant neoplasm of colon: Secondary | ICD-10-CM

## 2014-08-24 DIAGNOSIS — Q438 Other specified congenital malformations of intestine: Secondary | ICD-10-CM

## 2014-08-24 NOTE — Telephone Encounter (Signed)
Left message for patient to call regarding her upcoming appointment. (appointment time time changed from 12:30pm to 10:00am)

## 2014-08-30 NOTE — Telephone Encounter (Signed)
Pt ok with appointment.

## 2014-09-07 ENCOUNTER — Ambulatory Visit: Payer: Medicare Other | Admitting: Gynecology

## 2014-09-07 ENCOUNTER — Encounter: Payer: Self-pay | Admitting: Gynecology

## 2014-09-07 ENCOUNTER — Ambulatory Visit (INDEPENDENT_AMBULATORY_CARE_PROVIDER_SITE_OTHER): Payer: Medicare Other | Admitting: Gynecology

## 2014-09-07 VITALS — BP 130/90 | HR 84 | Resp 18 | Ht 66.25 in | Wt 151.0 lb

## 2014-09-07 DIAGNOSIS — Z01419 Encounter for gynecological examination (general) (routine) without abnormal findings: Secondary | ICD-10-CM

## 2014-09-07 DIAGNOSIS — G4700 Insomnia, unspecified: Secondary | ICD-10-CM

## 2014-09-07 MED ORDER — ZOLPIDEM TARTRATE 10 MG PO TABS: 10.0000 mg | ORAL_TABLET | Freq: Every evening | ORAL | Status: DC | PRN

## 2014-09-07 NOTE — Progress Notes (Signed)
68 y.o. Divorced Caucasian female   G1P1001 here for annual exam. Pt reports menses are absent due to Menopause.  She does not report hot flashes, does not have night sweats, does have vaginal dryness.  She is not using lubricants,.  She does not report post-menopasual bleeding.  Pt takes [place] often and feels much better in am, no hangover effect.  Otherwise wakes up at 2am, usually take 1/2 to 1/3  No LMP recorded. Patient is postmenopausal.          Sexually active: No.  The current method of family planning is post menopausal status.    Exercising: Yes.    walking 5 miles qd Last pap: 04/13/2010 Negative Abnormal PAP: yes, had colpo bx normal ever since Mammogram: 04/19/14 Bi-Rads 1: Negative BSE: yes  Colonoscopy: 08/24/14 incomplete going back DEXA:  05/2010, normal Alcohol: 5 drinks/wk Tobacco: no   Labs: Kelton Pillar, MD   Health Maintenance  Topic Date Due  . Tetanus/tdap  05/22/1966  . Colonoscopy  05/22/1997  . Zostavax  05/23/2007  . Pneumococcal Polysaccharide Vaccine Age 13 And Over  05/22/2012  . Influenza Vaccine  06/11/2014  . Mammogram  04/19/2016    Family History  Problem Relation Age of Onset  . Osteoporosis Mother     Patient Active Problem List   Diagnosis Date Noted  . Rotator cuff tear, non-traumatic 12/25/2012  . Impingement syndrome of right shoulder 12/25/2012  . 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 Post-operative nausea and vomiting 12/25/2012    Past Medical History  Diagnosis Date  . PONV (postoperative nausea and vomiting)   . Arthritis   . Fibroid 1995  . Fall     Past Surgical History  Procedure Laterality Date  . Tonsillectomy    . Breast enhancement surgery  1989  . Anal fissure repair    . Shoulder arthroscopy with rotator cuff repair Right 12/25/2012    Procedure: SHOULDER ARTHROSCOPY, DISTAL CLAVICLE RESECTION, SUBACROMIAL DECOMPRESSION, ROTATOR CUFF REPAIR, DEBRIDEMENT OF LABRIUM;  Surgeon: Lorn Junes, MD;   Location: [facility];  Service: Orthopedics;  Laterality: Right;  . Tubal ligation    . 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 Ascorbic Acid (VITAMIN C) 1000 MG tablet Take 2,000 mg by mouth daily.      . calcium-vitamin D (OSCAL WITH D) 500-200 MG-UNIT per tablet Take 1 tablet by mouth daily.      Marland Kitchen MAGNESIUM PO Take by mouth.      . Multiple Vitamin (MULTIVITAMIN WITH MINERALS) TABS Take 1 tablet by mouth daily.      . Naproxen Sodium (ALEVE PO) Take by mouth as needed.      Marland Kitchen POTASSIUM PO Take by mouth.      . zolpidem (AMBIEN) 10 MG tablet Take 1 tablet (10 mg total) by mouth at bedtime as needed for sleep.  20 tablet  0   No current facility-administered medications for this visit.    ROS: Pertinent items are noted in HPI.  Exam:    BP 130/90  Pulse 84  Resp 18  Ht 5' 6.25" (1.683 m)  Wt 151 lb (68.493 kg)  BMI 24.18 kg/m2 Weight change: @WEIGHTCHANGE @ Last 3 height recordings:  Ht Readings from Last 3 Encounters:  09/07/14 5' 6.25" (1.683 m)  09/03/13 5\' 6"  (1.676 m)  12/25/12 5\' 7"  (1.702 m)  General appearance: alert, cooperative and appears stated age Head: Normocephalic, without obvious abnormality, atraumatic Neck: no adenopathy, no carotid bruit, no JVD, supple, symmetrical, trachea midline and thyroid not enlarged, symmetric, no tenderness/mass/nodules Lungs: clear to auscultation bilaterally Breasts: Inspection negative, No nipple retraction or dimpling, No nipple discharge or bleeding, No axillary or supraclavicular adenopathy, bilateral breast implants with contractures Heart: regular rate and rhythm, S1, S2 normal, no murmur, click, rub or gallop Abdomen: soft, non-tender; bowel sounds normal; no masses,  no organomegaly Extremities: extremities normal, atraumatic, no cyanosis or edema Skin: Skin color, texture, turgor  normal. No rashes or lesions Lymph nodes: Cervical, supraclavicular, and axillary nodes normal. no inguinal nodes palpated Neurologic: Grossly normal   Pelvic: External genitalia:  atrophic appearance              Urethra: normal appearing urethra with no masses, tenderness or lesions              Bartholins and Skenes: normal                 Vagina: atrophic              Cervix: normal appearance              Pap taken: No.        Bimanual Exam:  Uterus:  uterus is normal size, shape, consistency and nontender                                      Adnexa:    normal adnexa in size, nontender and no masses                                      Rectovaginal: Confirms                                      Anus:  normal sphincter tone, no lesions       1. Encounter for routine gynecological examination  counseled on breast self exam, mammography screening, osteoporosis, adequate intake of calcium and vitamin D, diet and exercise return annually or prn Discussed PAP guideline changes, importance of weight bearing exercises, calcium, vit D and balanced diet.  2. Insomnia  - zolpidem (AMBIEN) 10 MG tablet; Take 1 tablet (10 mg total) by mouth at bedtime as needed for sleep.  Dispense: 30 tablet; Refill: 3  An After Visit Summary was printed and given to the patient.

## 2014-09-12 ENCOUNTER — Encounter: Payer: Self-pay | Admitting: Gynecology

## 2014-09-13 ENCOUNTER — Other Ambulatory Visit: Payer: Self-pay | Admitting: Gastroenterology

## 2014-09-13 DIAGNOSIS — Q438 Other specified congenital malformations of intestine: Secondary | ICD-10-CM

## 2014-09-19 ENCOUNTER — Ambulatory Visit
Admission: RE | Admit: 2014-09-19 | Discharge: 2014-09-19 | Disposition: A | Discharge status: Home / Self Care | Payer: Medicare Other | Source: Ambulatory Visit | Attending: Gastroenterology | Admitting: Gastroenterology

## 2014-09-19 ENCOUNTER — Other Ambulatory Visit: Payer: Self-pay | Admitting: *Deleted

## 2014-09-19 DIAGNOSIS — G4700 Insomnia, unspecified: Secondary | ICD-10-CM

## 2014-09-19 DIAGNOSIS — Q438 Other specified congenital malformations of intestine: Secondary | ICD-10-CM

## 2014-09-19 NOTE — Telephone Encounter (Signed)
Patient calling requesting rx be called in for Ambien. Patient was seen 09/07/14 for AEX appointment with Dr. Charlies Constable. In the system it's saying it was sent/phoned in. S/w with [place] at [facility] in [place] and they do not have rx/phoned in.  Please advise refill Dr. Sabra Heck.

## 2014-09-19 NOTE — Telephone Encounter (Signed)
Spoke with pt this morning and she stated she was Dr. Charlies Constable pt and that she was suppose to send in an rx for Ambien at her last visit (09/07/14). Stated to pt that Dr. Charlies Constable is no longer in the office and the rx was have to get approved from another provider. Pt asked for it to be done today, but I informed the pt that the providers was with pt all afternoon and that I could not say it would be be done today. Pt voiced understanding and asked for Jasmine to call her when its sent.   (Documentation from this morning telephone conversation)

## 2014-09-20 MED ORDER — ZOLPIDEM TARTRATE 10 MG PO TABS: 10.0000 mg | ORAL_TABLET | Freq: Every evening | ORAL | Status: DC | PRN

## 2014-09-20 NOTE — Telephone Encounter (Signed)
Rx faxed to [facility] in [place], [place], patient is aware per Larene Beach, CMA

## 2014-09-20 NOTE — Telephone Encounter (Signed)
I will take care of the RX when back in the office today so you can let her know it will be faxed over today.  Thanks.

## 2015-05-08 ENCOUNTER — Other Ambulatory Visit: Payer: Self-pay

## 2016-02-28 DIAGNOSIS — H524 Presbyopia: Secondary | ICD-10-CM | POA: Diagnosis not present

## 2016-06-04 DIAGNOSIS — E559 Vitamin D deficiency, unspecified: Secondary | ICD-10-CM | POA: Diagnosis not present

## 2016-06-04 DIAGNOSIS — M1990 Unspecified osteoarthritis, unspecified site: Secondary | ICD-10-CM | POA: Diagnosis not present

## 2016-06-04 DIAGNOSIS — M8588 Other specified disorders of bone density and structure, other site: Secondary | ICD-10-CM | POA: Diagnosis not present

## 2016-06-04 DIAGNOSIS — Z0000 Encounter for general adult medical examination without abnormal findings: Secondary | ICD-10-CM | POA: Diagnosis not present

## 2016-06-10 ENCOUNTER — Encounter: Payer: Self-pay | Admitting: Cardiology

## 2016-06-24 ENCOUNTER — Ambulatory Visit: Payer: Medicare Other | Admitting: Cardiology

## 2016-07-28 DIAGNOSIS — Z8249 Family history of ischemic heart disease and other diseases of the circulatory system: Secondary | ICD-10-CM | POA: Insufficient documentation

## 2016-07-28 NOTE — Progress Notes (Signed)
Cardiology Office Note    Date:  07/30/2016   ID:  Glenda Hunt, DOB 10-Oct-1946, MRN [id number]  PCP:  Osborne Casco, MD  Cardiologist:  Fransico Him, MD   Chief Complaint  Patient presents with  . New Evaluation    family history of CAD    History of Present Illness:  Glenda Hunt is a 70 y.o. female with no prior cardiac history who presents today for evaluation for CAD given family history of CAD in the past.  Her only cardiac risk factors are post menopausal state, remote smoking and family history of CAD.  She denies any chest pain or pressure, SOB, DOE, LE edema, dizziness, claudication or syncope.  She has a remote history of tobacco use for about 20 years.  She has a family history of CAD although not at an early age in life. She will notice a faster heart beat when she is laying down at night but no irregularity or skipping.  She has noticed that it is harder for her to walk like she used to due to fatigue. She denies any dizziness or syncope.    Past Medical History:  Diagnosis Date  . Arthritis   . Fall   . Fibroid 1995  . PONV (postoperative nausea and vomiting)     Past Surgical History:  Procedure Laterality Date  . ANAL FISSURE REPAIR    . BREAST ENHANCEMENT SURGERY  1989  . SHOULDER ARTHROSCOPY WITH ROTATOR CUFF REPAIR Right 12/25/2012   Procedure: SHOULDER ARTHROSCOPY, DISTAL CLAVICLE RESECTION, SUBACROMIAL DECOMPRESSION, ROTATOR CUFF REPAIR, DEBRIDEMENT OF LABRIUM;  Surgeon: Lorn Junes, MD;  Location: [facility];  Service: Orthopedics;  Laterality: Right;  . TONSILLECTOMY    . TUBAL LIGATION      Current Medications: Outpatient Medications Prior to Visit  Medication Sig Dispense Refill  . Ascorbic Acid (VITAMIN C) 1000 MG tablet Take 2,000 mg by mouth daily.    . calcium-vitamin D (OSCAL WITH D) 500-200 MG-UNIT per tablet Take 1 tablet by mouth daily.    . Multiple Vitamin (MULTIVITAMIN WITH MINERALS) TABS Take 1 tablet by  mouth daily.    . Naproxen Sodium (ALEVE PO) Take by mouth as needed.    Marland Kitchen MAGNESIUM PO Take by mouth.    Marland Kitchen POTASSIUM PO Take by mouth.    . zolpidem (AMBIEN) 10 MG tablet Take 1 tablet (10 mg total) by mouth at bedtime as needed for sleep. (Patient not taking: Reported on 07/30/2016) 30 tablet 3   No facility-administered medications prior to visit.      Allergies:   Codeine; Other; and Penicillins   Social History   Social History  . Marital status: Divorced    Spouse name: N/A  . Number of children: N/A  . Years of education: N/A   Social History Main Topics  . Smoking status: Former Smoker    Quit date: 12/25/1992  . Smokeless tobacco: Never Used  . Alcohol use 2.5 oz/week    5 Standard drinks or equivalent per week     Comment: 1 glass daily  . Drug use: No  . Sexual activity: No   Other Topics Concern  . None   Social History Narrative  . None     Family History:  The patient's family history includes Arrhythmia in her brother, brother, and brother; CVA in her father; Heart attack (age of onset: 32) in her brother; Heart disease in her brother and father; Heart failure in her father;  Osteoporosis in her mother; Parkinson's disease in her mother; Rheum arthritis in her sister.   ROS:   Please see the history of present illness.    ROS All other systems reviewed and are negative.  No flowsheet data found.     PHYSICAL EXAM:   VS:  BP 126/76   Pulse (!) 58   Ht 5' 6.25" (1.683 m)   Wt 158 lb 12.8 oz (72 kg)   BMI 25.44 kg/m    GEN: Well nourished, well developed, in no acute distress  HEENT: normal  Neck: no JVD, carotid bruits, or masses Cardiac: RRR; no murmurs, rubs, or gallops,no edema.  Intact distal pulses bilaterally.  Respiratory:  clear to auscultation bilaterally, normal work of breathing GI: soft, nontender, nondistended, + BS MS: no deformity or atrophy  Skin: warm and dry, no rash Neuro:  Alert and Oriented x 3, Strength and sensation are  intact Psych: euthymic mood, full affect  Wt Readings from Last 3 Encounters:  07/30/16 158 lb 12.8 oz (72 kg)  09/07/14 151 lb (68.5 kg)  09/03/13 155 lb (70.3 kg)      Studies/Labs Reviewed:   EKG:  EKG is ordered today.  The ekg ordered today demonstrates Sinus bradycardia at 58bpm with isolated PAC with no ST changes  Recent Labs: No results found for requested labs within last 8760 hours.   Lipid Panel No results found for: CHOL, TRIG, HDL, CHOLHDL, VLDL, LDLCALC, LDLDIRECT  Additional studies/ records that were reviewed today include:  none    ASSESSMENT:    1. Family history of early CAD   2. Fatigue due to excessive exertion, initial encounter      PLAN:  In order of problems listed above:  1. Family history of CAD - she is very concerned about her risk for cardiac events in the future.  Her family history was not premature CAD and occurred later in life.  Her CRFs include remote tobacco use and postmenopausal state and family history of CAD.  Her EKG is nonischemic.  She has had some exercise intolerance recently.  I will get a stress myoview to assess for ischemia.  I will also get a chest CT calcium score to assess future risk.  2. Exertional fatigue with new exercise intolerance - see above    Medication Adjustments/Labs and Tests Ordered: Current medicines are reviewed at length with the patient today.  Concerns regarding medicines are outlined above.  Medication changes, Labs and Tests ordered today are listed in the Patient Instructions below.  There are no Patient Instructions on file for this visit.   Signed, Fransico Him, MD  07/30/2016 11:42 AM    [facility] [address], [place], [place]  [address] Phone: [phone]; Fax: [phone]

## 2016-07-30 ENCOUNTER — Ambulatory Visit (INDEPENDENT_AMBULATORY_CARE_PROVIDER_SITE_OTHER): Payer: Medicare Other | Admitting: Cardiology

## 2016-07-30 ENCOUNTER — Encounter: Payer: Self-pay | Admitting: Cardiology

## 2016-07-30 DIAGNOSIS — T733XXA Exhaustion due to excessive exertion, initial encounter: Secondary | ICD-10-CM | POA: Insufficient documentation

## 2016-07-30 DIAGNOSIS — Z8249 Family history of ischemic heart disease and other diseases of the circulatory system: Secondary | ICD-10-CM | POA: Diagnosis not present

## 2016-07-30 NOTE — Patient Instructions (Signed)
Medication Instructions:  Your physician recommends that you continue on your current medications as directed. Please refer to the Current Medication list given to you today.   Labwork: NONE  Testing/Procedures: 1. CALCIUM SCORE TO BE DONE WITH OUR CT DEPT   2. Your physician has requested that you have en exercise stress myoview. For further information please visit [email or web address]. Please follow instruction sheet, as given.     Follow-Up: AS NEEDED WITH DR. Radford Pax  Any Other Special Instructions Will Be Listed Below (If Applicable).     If you need a refill on your cardiac medications before your next appointment, please call your pharmacy.

## 2016-07-31 ENCOUNTER — Telehealth (HOSPITAL_COMMUNITY): Payer: Self-pay | Admitting: *Deleted

## 2016-07-31 NOTE — Telephone Encounter (Signed)
Patient given detailed instructions per Myocardial Perfusion Study Information Sheet for the test on 08/05/16. Patient notified to arrive 15 minutes early and that it is imperative to arrive on time for appointment to keep from having the test rescheduled.  If you need to cancel or reschedule your appointment, please call the office within 24 hours of your appointment. Failure to do so may result in a cancellation of your appointment, and a $50 no show fee. Patient verbalized understanding. Glenda Hunt    

## 2016-08-05 ENCOUNTER — Ambulatory Visit (INDEPENDENT_AMBULATORY_CARE_PROVIDER_SITE_OTHER)
Admission: RE | Admit: 2016-08-05 | Discharge: 2016-08-05 | Disposition: A | Discharge status: Home / Self Care | Payer: Medicare Other | Source: Ambulatory Visit | Attending: Cardiology | Admitting: Cardiology

## 2016-08-05 ENCOUNTER — Ambulatory Visit (HOSPITAL_COMMUNITY): Payer: Medicare Other | Attending: Cardiovascular Disease

## 2016-08-05 DIAGNOSIS — Z8249 Family history of ischemic heart disease and other diseases of the circulatory system: Secondary | ICD-10-CM

## 2016-08-05 DIAGNOSIS — R5383 Other fatigue: Secondary | ICD-10-CM | POA: Diagnosis not present

## 2016-08-05 DIAGNOSIS — X58XXXA Exposure to other specified factors, initial encounter: Secondary | ICD-10-CM | POA: Insufficient documentation

## 2016-08-05 DIAGNOSIS — T733XXA Exhaustion due to excessive exertion, initial encounter: Secondary | ICD-10-CM | POA: Diagnosis not present

## 2016-08-05 LAB — MYOCARDIAL PERFUSION IMAGING
Estimated workload: 7 METS
Exercise duration (min): 5 min
Exercise duration (sec): 30 s
LHR: 0.32
LV dias vol: 86 mL (ref 46–106)
LV sys vol: 41 mL
MPHR: 151 {beats}/min
NUC STRESS TID: 1.02
Peak HR: 136 {beats}/min
Percent HR: 90 %
RPE: 18
Rest HR: 56 {beats}/min
SDS: 1
SRS: 1
SSS: 2

## 2016-08-05 MED ORDER — TECHNETIUM TC 99M TETROFOSMIN IV KIT
11.0000 | PACK | Freq: Once | INTRAVENOUS | Status: AC | PRN
  Administered 2016-08-05: 11 via INTRAVENOUS
  Filled 2016-08-05: qty 11

## 2016-08-05 MED ORDER — TECHNETIUM TC 99M TETROFOSMIN IV KIT
32.7000 | PACK | Freq: Once | INTRAVENOUS | Status: AC | PRN
  Administered 2016-08-05: 32.7 via INTRAVENOUS
  Filled 2016-08-05: qty 33

## 2016-08-07 ENCOUNTER — Telehealth: Payer: Self-pay

## 2016-08-07 DIAGNOSIS — H524 Presbyopia: Secondary | ICD-10-CM | POA: Diagnosis not present

## 2016-08-07 DIAGNOSIS — Z8249 Family history of ischemic heart disease and other diseases of the circulatory system: Secondary | ICD-10-CM

## 2016-08-07 DIAGNOSIS — H2513 Age-related nuclear cataract, bilateral: Secondary | ICD-10-CM | POA: Diagnosis not present

## 2016-08-07 DIAGNOSIS — I10 Essential (primary) hypertension: Secondary | ICD-10-CM

## 2016-08-07 NOTE — Telephone Encounter (Signed)
-----   Message from Sueanne Margarita, MD sent at 08/05/2016  3:23 PM EDT ----- Please let patient know that stress test was fine but her BP was poorly controlled.  Please have her come in for 24 hour BP monitor

## 2016-08-07 NOTE — Telephone Encounter (Addendum)
Notes Recorded by Sueanne Margarita, MD on 08/05/2016 at 3:21 PM EDT Multiple pulmonary nodules that need to be followed up on .Marland Kitchen Please forward to PCP to followup on. Patient had a very tiny foci of calcium on CT in LAD. Needs risk factor modification. Please get a copy of FLP from PCP.    Informed patient of results and verbal understanding expressed.   24 hour BP monitor ordered for scheduling. Patient agrees with treatment plan. Instructed patient to limit fats in diet and increase exercise.  Patient states she has not had her cholesterol checked in years. She understands she will have fasting labs drawn when she comes to get her monitor. She agrees with treatment plan.

## 2016-08-08 ENCOUNTER — Encounter: Payer: Self-pay | Admitting: Cardiology

## 2016-08-09 ENCOUNTER — Telehealth: Payer: Self-pay | Admitting: Cardiology

## 2016-08-09 DIAGNOSIS — I2510 Atherosclerotic heart disease of native coronary artery without angina pectoris: Secondary | ICD-10-CM

## 2016-08-09 NOTE — Telephone Encounter (Signed)
New message ° ° ° °Pt verbalized that she is returning call for rn  °

## 2016-08-27 DIAGNOSIS — Z1231 Encounter for screening mammogram for malignant neoplasm of breast: Secondary | ICD-10-CM | POA: Diagnosis not present

## 2016-08-29 ENCOUNTER — Encounter: Payer: Self-pay | Admitting: *Deleted

## 2016-08-29 ENCOUNTER — Ambulatory Visit (INDEPENDENT_AMBULATORY_CARE_PROVIDER_SITE_OTHER): Payer: Medicare Other

## 2016-08-29 ENCOUNTER — Other Ambulatory Visit: Payer: Medicare Other | Admitting: *Deleted

## 2016-08-29 DIAGNOSIS — I10 Essential (primary) hypertension: Secondary | ICD-10-CM

## 2016-08-29 DIAGNOSIS — Z8249 Family history of ischemic heart disease and other diseases of the circulatory system: Secondary | ICD-10-CM

## 2016-08-29 DIAGNOSIS — Z79899 Other long term (current) drug therapy: Secondary | ICD-10-CM | POA: Diagnosis not present

## 2016-08-29 LAB — LIPID PANEL
CHOL/HDL RATIO: 2.6 ratio (ref <=5.0)
Cholesterol: 218 mg/dL — ABNORMAL HIGH (ref 125–200)
HDL: 84 mg/dL (ref >=46)
LDL CALC: 118 mg/dL (ref <130)
Triglycerides: 80 mg/dL (ref <150)
VLDL: 16 mg/dL (ref <30)

## 2016-08-29 LAB — HEPATIC FUNCTION PANEL
ALK PHOS: 40 U/L (ref 33–130)
ALT: 20 U/L (ref 6–29)
AST: 21 U/L (ref 10–35)
Albumin: 4.1 g/dL (ref 3.6–5.1)
BILIRUBIN DIRECT: 0.1 mg/dL (ref <=0.2)
BILIRUBIN INDIRECT: 0.3 mg/dL (ref 0.2–1.2)
BILIRUBIN TOTAL: 0.4 mg/dL (ref 0.2–1.2)
Total Protein: 6.4 g/dL (ref 6.1–8.1)

## 2016-08-29 NOTE — Progress Notes (Signed)
Patient ID: Glenda Hunt, female   DOB: [birth year], 70 y.o.   MRN: [id number] 24 hour ambulatory blood pressure monitor applied to patient.

## 2016-09-01 ENCOUNTER — Encounter: Payer: Self-pay | Admitting: Cardiology

## 2016-09-01 DIAGNOSIS — I2510 Atherosclerotic heart disease of native coronary artery without angina pectoris: Secondary | ICD-10-CM | POA: Insufficient documentation

## 2016-09-03 ENCOUNTER — Encounter: Payer: Self-pay | Admitting: Cardiology

## 2016-09-03 NOTE — Telephone Encounter (Signed)
This encounter was created in error - please disregard.

## 2016-09-03 NOTE — Telephone Encounter (Signed)
Pt is returning phone call ° °

## 2016-09-04 NOTE — Telephone Encounter (Signed)
Informed patient of results and verbal understanding expressed.  Patient refuses to start statin at this time. She states she will watch her diet and get exercise and will recheck fasting labs in 4 months. Labs ordered and schedueld for 01/08/17. Patient agrees with treatment plan.

## 2016-09-04 NOTE — Telephone Encounter (Signed)
Follow up    Pt verbalized that she keeps calling for the rn to return her call  She did not disclose any reason

## 2016-09-04 NOTE — Telephone Encounter (Signed)
-----   Message from Sueanne Margarita, MD sent at 09/01/2016  4:57 PM EDT ----- LDL goal < 70 - start Lipitor 10mg  daily and repeat FLP and ALT in 6 weeks

## 2016-10-08 DIAGNOSIS — J069 Acute upper respiratory infection, unspecified: Secondary | ICD-10-CM | POA: Diagnosis not present

## 2016-10-08 DIAGNOSIS — R05 Cough: Secondary | ICD-10-CM | POA: Diagnosis not present

## 2017-01-08 ENCOUNTER — Other Ambulatory Visit: Payer: Medicare Other | Admitting: *Deleted

## 2017-01-08 DIAGNOSIS — I2510 Atherosclerotic heart disease of native coronary artery without angina pectoris: Secondary | ICD-10-CM

## 2017-01-08 LAB — LIPID PANEL
Chol/HDL Ratio: 2.7 ratio units (ref 0.0–4.4)
Cholesterol, Total: 203 mg/dL — ABNORMAL HIGH (ref 100–199)
HDL: 74 mg/dL (ref >39)
LDL CALC: 113 mg/dL — AB (ref 0–99)
Triglycerides: 81 mg/dL (ref 0–149)
VLDL Cholesterol Cal: 16 mg/dL (ref 5–40)

## 2017-01-08 LAB — ALT: ALT: 20 IU/L (ref 0–32)

## 2017-01-08 NOTE — Addendum Note (Signed)
Addended by: Eulis Foster on: 01/08/2017 10:34 AM   Modules accepted: Orders

## 2017-01-10 ENCOUNTER — Telehealth: Payer: Self-pay | Admitting: Cardiology

## 2017-01-10 NOTE — Telephone Encounter (Signed)
Attempted to call the pt back to endorse lab results and recommendations per Dr Radford Pax, as mentioned below, and pt didn't answer.      Notes Recorded by Sueanne Margarita, MD on 01/09/2017 at 8:48 AM EST LDL goal < 100 as she has minimal coronary calcium. Start Lipitor 10mg  daily and repeat FLP and ALT in 6 weeks

## 2017-01-10 NOTE — Telephone Encounter (Signed)
Follow Up   Pt returning phone call from nurse. Thinks it was regarding labs. Requesting call back

## 2017-01-13 NOTE — Telephone Encounter (Signed)
Please forward results of coronary calcium score to patient's PCP

## 2017-01-13 NOTE — Telephone Encounter (Signed)
Calcium score results forwarded to PCP.

## 2017-01-13 NOTE — Telephone Encounter (Signed)
Left message for patient to call back  

## 2017-01-13 NOTE — Telephone Encounter (Signed)
Glenda Hunt is returning a call. Thanks

## 2017-01-13 NOTE — Telephone Encounter (Signed)
Informed patient of her lab results and Dr. Landis Gandy recommendation. Patient is refusing to take Lipitor at this time, since her LDL has improved from her last lab work. Patient stated she has been exercising and eating better to help with her cholesterol. Informed patient that Dr. Radford Pax would be notified of her response. Will see if Dr. Radford Pax has further advisement.

## 2017-02-05 DIAGNOSIS — H524 Presbyopia: Secondary | ICD-10-CM | POA: Diagnosis not present

## 2017-02-05 DIAGNOSIS — H2513 Age-related nuclear cataract, bilateral: Secondary | ICD-10-CM | POA: Diagnosis not present

## 2017-02-13 ENCOUNTER — Other Ambulatory Visit: Payer: Self-pay | Admitting: Family Medicine

## 2017-02-13 DIAGNOSIS — R918 Other nonspecific abnormal finding of lung field: Secondary | ICD-10-CM

## 2017-02-17 DIAGNOSIS — H2512 Age-related nuclear cataract, left eye: Secondary | ICD-10-CM | POA: Diagnosis not present

## 2017-02-18 ENCOUNTER — Ambulatory Visit
Admission: RE | Admit: 2017-02-18 | Discharge: 2017-02-18 | Disposition: A | Discharge status: Home / Self Care | Payer: Medicare Other | Source: Ambulatory Visit | Attending: Family Medicine | Admitting: Family Medicine

## 2017-02-18 DIAGNOSIS — R918 Other nonspecific abnormal finding of lung field: Secondary | ICD-10-CM | POA: Diagnosis not present

## 2017-03-03 DIAGNOSIS — H25812 Combined forms of age-related cataract, left eye: Secondary | ICD-10-CM | POA: Diagnosis not present

## 2017-03-03 DIAGNOSIS — H2512 Age-related nuclear cataract, left eye: Secondary | ICD-10-CM | POA: Diagnosis not present

## 2017-03-11 DIAGNOSIS — H2511 Age-related nuclear cataract, right eye: Secondary | ICD-10-CM | POA: Diagnosis not present

## 2017-03-24 DIAGNOSIS — H25811 Combined forms of age-related cataract, right eye: Secondary | ICD-10-CM | POA: Diagnosis not present

## 2017-03-24 DIAGNOSIS — H2511 Age-related nuclear cataract, right eye: Secondary | ICD-10-CM | POA: Diagnosis not present

## 2017-04-29 DIAGNOSIS — Z961 Presence of intraocular lens: Secondary | ICD-10-CM | POA: Diagnosis not present

## 2017-04-29 DIAGNOSIS — H26491 Other secondary cataract, right eye: Secondary | ICD-10-CM | POA: Diagnosis not present

## 2017-06-19 DIAGNOSIS — M1990 Unspecified osteoarthritis, unspecified site: Secondary | ICD-10-CM | POA: Diagnosis not present

## 2017-06-19 DIAGNOSIS — E559 Vitamin D deficiency, unspecified: Secondary | ICD-10-CM | POA: Diagnosis not present

## 2017-06-19 DIAGNOSIS — Z0000 Encounter for general adult medical examination without abnormal findings: Secondary | ICD-10-CM | POA: Diagnosis not present

## 2017-06-19 DIAGNOSIS — M8588 Other specified disorders of bone density and structure, other site: Secondary | ICD-10-CM | POA: Diagnosis not present

## 2017-06-27 ENCOUNTER — Other Ambulatory Visit: Payer: Self-pay | Admitting: Family Medicine

## 2017-06-27 DIAGNOSIS — J329 Chronic sinusitis, unspecified: Secondary | ICD-10-CM

## 2017-06-30 ENCOUNTER — Ambulatory Visit
Admission: RE | Admit: 2017-06-30 | Discharge: 2017-06-30 | Disposition: A | Discharge status: Home / Self Care | Payer: Medicare Other | Source: Ambulatory Visit | Attending: Family Medicine | Admitting: Family Medicine

## 2017-06-30 DIAGNOSIS — R51 Headache: Secondary | ICD-10-CM | POA: Diagnosis not present

## 2017-06-30 DIAGNOSIS — J329 Chronic sinusitis, unspecified: Secondary | ICD-10-CM

## 2017-09-04 DIAGNOSIS — Z1231 Encounter for screening mammogram for malignant neoplasm of breast: Secondary | ICD-10-CM | POA: Diagnosis not present

## 2017-09-05 DIAGNOSIS — H524 Presbyopia: Secondary | ICD-10-CM | POA: Diagnosis not present

## 2017-09-05 DIAGNOSIS — Z961 Presence of intraocular lens: Secondary | ICD-10-CM | POA: Diagnosis not present

## 2017-09-05 DIAGNOSIS — H26493 Other secondary cataract, bilateral: Secondary | ICD-10-CM | POA: Diagnosis not present

## 2017-09-30 DIAGNOSIS — H524 Presbyopia: Secondary | ICD-10-CM | POA: Diagnosis not present

## 2017-09-30 DIAGNOSIS — Z01810 Encounter for preprocedural cardiovascular examination: Secondary | ICD-10-CM | POA: Diagnosis not present

## 2017-09-30 DIAGNOSIS — H43813 Vitreous degeneration, bilateral: Secondary | ICD-10-CM | POA: Diagnosis not present

## 2017-09-30 DIAGNOSIS — H26492 Other secondary cataract, left eye: Secondary | ICD-10-CM | POA: Diagnosis not present

## 2017-10-23 DIAGNOSIS — H26491 Other secondary cataract, right eye: Secondary | ICD-10-CM | POA: Diagnosis not present

## 2017-10-23 DIAGNOSIS — H524 Presbyopia: Secondary | ICD-10-CM | POA: Diagnosis not present

## 2017-12-01 DIAGNOSIS — M792 Neuralgia and neuritis, unspecified: Secondary | ICD-10-CM | POA: Diagnosis not present

## 2018-01-26 DIAGNOSIS — M5033 Other cervical disc degeneration, cervicothoracic region: Secondary | ICD-10-CM | POA: Diagnosis not present

## 2018-01-26 DIAGNOSIS — M5136 Other intervertebral disc degeneration, lumbar region: Secondary | ICD-10-CM | POA: Diagnosis not present

## 2018-01-26 DIAGNOSIS — M9903 Segmental and somatic dysfunction of lumbar region: Secondary | ICD-10-CM | POA: Diagnosis not present

## 2018-01-26 DIAGNOSIS — M9901 Segmental and somatic dysfunction of cervical region: Secondary | ICD-10-CM | POA: Diagnosis not present

## 2018-01-27 DIAGNOSIS — M9901 Segmental and somatic dysfunction of cervical region: Secondary | ICD-10-CM | POA: Diagnosis not present

## 2018-01-27 DIAGNOSIS — M5033 Other cervical disc degeneration, cervicothoracic region: Secondary | ICD-10-CM | POA: Diagnosis not present

## 2018-01-27 DIAGNOSIS — M5136 Other intervertebral disc degeneration, lumbar region: Secondary | ICD-10-CM | POA: Diagnosis not present

## 2018-01-27 DIAGNOSIS — M9903 Segmental and somatic dysfunction of lumbar region: Secondary | ICD-10-CM | POA: Diagnosis not present

## 2018-02-03 DIAGNOSIS — M9901 Segmental and somatic dysfunction of cervical region: Secondary | ICD-10-CM | POA: Diagnosis not present

## 2018-02-03 DIAGNOSIS — M5136 Other intervertebral disc degeneration, lumbar region: Secondary | ICD-10-CM | POA: Diagnosis not present

## 2018-02-03 DIAGNOSIS — M5033 Other cervical disc degeneration, cervicothoracic region: Secondary | ICD-10-CM | POA: Diagnosis not present

## 2018-02-03 DIAGNOSIS — M9903 Segmental and somatic dysfunction of lumbar region: Secondary | ICD-10-CM | POA: Diagnosis not present

## 2018-02-05 DIAGNOSIS — M5136 Other intervertebral disc degeneration, lumbar region: Secondary | ICD-10-CM | POA: Diagnosis not present

## 2018-02-05 DIAGNOSIS — M9901 Segmental and somatic dysfunction of cervical region: Secondary | ICD-10-CM | POA: Diagnosis not present

## 2018-02-05 DIAGNOSIS — M9903 Segmental and somatic dysfunction of lumbar region: Secondary | ICD-10-CM | POA: Diagnosis not present

## 2018-02-05 DIAGNOSIS — M5033 Other cervical disc degeneration, cervicothoracic region: Secondary | ICD-10-CM | POA: Diagnosis not present

## 2018-02-10 DIAGNOSIS — M5033 Other cervical disc degeneration, cervicothoracic region: Secondary | ICD-10-CM | POA: Diagnosis not present

## 2018-02-10 DIAGNOSIS — M9901 Segmental and somatic dysfunction of cervical region: Secondary | ICD-10-CM | POA: Diagnosis not present

## 2018-02-10 DIAGNOSIS — M5136 Other intervertebral disc degeneration, lumbar region: Secondary | ICD-10-CM | POA: Diagnosis not present

## 2018-02-10 DIAGNOSIS — M9903 Segmental and somatic dysfunction of lumbar region: Secondary | ICD-10-CM | POA: Diagnosis not present

## 2018-02-12 DIAGNOSIS — M5033 Other cervical disc degeneration, cervicothoracic region: Secondary | ICD-10-CM | POA: Diagnosis not present

## 2018-02-12 DIAGNOSIS — M9901 Segmental and somatic dysfunction of cervical region: Secondary | ICD-10-CM | POA: Diagnosis not present

## 2018-02-12 DIAGNOSIS — M5136 Other intervertebral disc degeneration, lumbar region: Secondary | ICD-10-CM | POA: Diagnosis not present

## 2018-02-12 DIAGNOSIS — M9903 Segmental and somatic dysfunction of lumbar region: Secondary | ICD-10-CM | POA: Diagnosis not present

## 2018-03-18 DIAGNOSIS — M542 Cervicalgia: Secondary | ICD-10-CM | POA: Diagnosis not present

## 2018-05-22 DIAGNOSIS — M542 Cervicalgia: Secondary | ICD-10-CM | POA: Diagnosis not present

## 2018-05-28 DIAGNOSIS — M542 Cervicalgia: Secondary | ICD-10-CM | POA: Diagnosis not present

## 2018-06-03 DIAGNOSIS — Z961 Presence of intraocular lens: Secondary | ICD-10-CM | POA: Diagnosis not present

## 2018-06-03 DIAGNOSIS — H524 Presbyopia: Secondary | ICD-10-CM | POA: Diagnosis not present

## 2018-06-03 DIAGNOSIS — H43813 Vitreous degeneration, bilateral: Secondary | ICD-10-CM | POA: Diagnosis not present

## 2018-06-03 DIAGNOSIS — H04123 Dry eye syndrome of bilateral lacrimal glands: Secondary | ICD-10-CM | POA: Diagnosis not present

## 2018-06-04 DIAGNOSIS — M5032 Other cervical disc degeneration, mid-cervical region, unspecified level: Secondary | ICD-10-CM | POA: Diagnosis not present

## 2018-06-04 DIAGNOSIS — M542 Cervicalgia: Secondary | ICD-10-CM | POA: Diagnosis not present

## 2018-06-04 DIAGNOSIS — J069 Acute upper respiratory infection, unspecified: Secondary | ICD-10-CM | POA: Diagnosis not present

## 2018-06-04 DIAGNOSIS — M47812 Spondylosis without myelopathy or radiculopathy, cervical region: Secondary | ICD-10-CM | POA: Diagnosis not present

## 2018-07-01 DIAGNOSIS — H43393 Other vitreous opacities, bilateral: Secondary | ICD-10-CM | POA: Diagnosis not present

## 2018-07-01 DIAGNOSIS — H43813 Vitreous degeneration, bilateral: Secondary | ICD-10-CM | POA: Diagnosis not present

## 2018-07-09 ENCOUNTER — Other Ambulatory Visit: Payer: Self-pay | Admitting: Family Medicine

## 2018-07-09 DIAGNOSIS — E559 Vitamin D deficiency, unspecified: Secondary | ICD-10-CM | POA: Diagnosis not present

## 2018-07-09 DIAGNOSIS — R911 Solitary pulmonary nodule: Secondary | ICD-10-CM

## 2018-07-09 DIAGNOSIS — Z1159 Encounter for screening for other viral diseases: Secondary | ICD-10-CM | POA: Diagnosis not present

## 2018-07-09 DIAGNOSIS — M8588 Other specified disorders of bone density and structure, other site: Secondary | ICD-10-CM | POA: Diagnosis not present

## 2018-07-09 DIAGNOSIS — Z0000 Encounter for general adult medical examination without abnormal findings: Secondary | ICD-10-CM | POA: Diagnosis not present

## 2018-07-09 DIAGNOSIS — G4700 Insomnia, unspecified: Secondary | ICD-10-CM | POA: Diagnosis not present

## 2018-07-28 ENCOUNTER — Ambulatory Visit
Admission: RE | Admit: 2018-07-28 | Discharge: 2018-07-28 | Disposition: A | Discharge status: Home / Self Care | Payer: Medicare Other | Source: Ambulatory Visit | Attending: Family Medicine | Admitting: Family Medicine

## 2018-07-28 DIAGNOSIS — R911 Solitary pulmonary nodule: Secondary | ICD-10-CM

## 2018-07-28 DIAGNOSIS — R918 Other nonspecific abnormal finding of lung field: Secondary | ICD-10-CM | POA: Diagnosis not present

## 2018-08-25 DIAGNOSIS — M47812 Spondylosis without myelopathy or radiculopathy, cervical region: Secondary | ICD-10-CM | POA: Diagnosis not present

## 2018-08-25 DIAGNOSIS — M542 Cervicalgia: Secondary | ICD-10-CM | POA: Diagnosis not present

## 2018-09-03 DIAGNOSIS — M47812 Spondylosis without myelopathy or radiculopathy, cervical region: Secondary | ICD-10-CM | POA: Diagnosis not present

## 2018-09-03 DIAGNOSIS — M542 Cervicalgia: Secondary | ICD-10-CM | POA: Diagnosis not present

## 2018-09-07 DIAGNOSIS — M47812 Spondylosis without myelopathy or radiculopathy, cervical region: Secondary | ICD-10-CM | POA: Diagnosis not present

## 2018-09-07 DIAGNOSIS — M542 Cervicalgia: Secondary | ICD-10-CM | POA: Diagnosis not present

## 2018-09-10 DIAGNOSIS — Z1231 Encounter for screening mammogram for malignant neoplasm of breast: Secondary | ICD-10-CM | POA: Diagnosis not present

## 2018-09-10 DIAGNOSIS — Z780 Asymptomatic menopausal state: Secondary | ICD-10-CM | POA: Diagnosis not present

## 2018-09-10 DIAGNOSIS — E349 Endocrine disorder, unspecified: Secondary | ICD-10-CM | POA: Diagnosis not present

## 2018-09-15 DIAGNOSIS — M542 Cervicalgia: Secondary | ICD-10-CM | POA: Diagnosis not present

## 2018-09-15 DIAGNOSIS — M47812 Spondylosis without myelopathy or radiculopathy, cervical region: Secondary | ICD-10-CM | POA: Diagnosis not present

## 2018-12-24 DIAGNOSIS — H43813 Vitreous degeneration, bilateral: Secondary | ICD-10-CM | POA: Diagnosis not present

## 2018-12-24 DIAGNOSIS — Z961 Presence of intraocular lens: Secondary | ICD-10-CM | POA: Diagnosis not present

## 2018-12-24 DIAGNOSIS — H04123 Dry eye syndrome of bilateral lacrimal glands: Secondary | ICD-10-CM | POA: Diagnosis not present

## 2019-07-22 DIAGNOSIS — M8588 Other specified disorders of bone density and structure, other site: Secondary | ICD-10-CM | POA: Diagnosis not present

## 2019-07-22 DIAGNOSIS — M1990 Unspecified osteoarthritis, unspecified site: Secondary | ICD-10-CM | POA: Diagnosis not present

## 2019-07-22 DIAGNOSIS — I2510 Atherosclerotic heart disease of native coronary artery without angina pectoris: Secondary | ICD-10-CM | POA: Diagnosis not present

## 2019-07-22 DIAGNOSIS — Z0000 Encounter for general adult medical examination without abnormal findings: Secondary | ICD-10-CM | POA: Diagnosis not present

## 2019-07-26 DIAGNOSIS — Z0000 Encounter for general adult medical examination without abnormal findings: Secondary | ICD-10-CM | POA: Diagnosis not present

## 2019-07-26 DIAGNOSIS — Z131 Encounter for screening for diabetes mellitus: Secondary | ICD-10-CM | POA: Diagnosis not present

## 2019-07-26 DIAGNOSIS — Z23 Encounter for immunization: Secondary | ICD-10-CM | POA: Diagnosis not present

## 2019-07-26 DIAGNOSIS — I2510 Atherosclerotic heart disease of native coronary artery without angina pectoris: Secondary | ICD-10-CM | POA: Diagnosis not present

## 2019-08-13 ENCOUNTER — Telehealth: Payer: Self-pay | Admitting: Acute Care

## 2019-08-17 NOTE — Telephone Encounter (Signed)
Spoke with pt regarding lung cancer screening.  Pt agreed that she quit smoking in 1989.  Pt advised that she will not qualify for lung cancer screening due to quitting smoking over 15 years ago. PT verbalized understanding.  Referral cancelled. Letter sent to Dr Laurann Montana to make her aware.

## 2019-08-17 NOTE — Telephone Encounter (Signed)
LMTC x 1  

## 2019-10-12 DIAGNOSIS — Z1231 Encounter for screening mammogram for malignant neoplasm of breast: Secondary | ICD-10-CM | POA: Diagnosis not present

## 2019-11-02 DIAGNOSIS — Z20828 Contact with and (suspected) exposure to other viral communicable diseases: Secondary | ICD-10-CM | POA: Diagnosis not present

## 2020-06-01 DIAGNOSIS — M545 Low back pain: Secondary | ICD-10-CM | POA: Diagnosis not present

## 2020-06-01 DIAGNOSIS — M47812 Spondylosis without myelopathy or radiculopathy, cervical region: Secondary | ICD-10-CM | POA: Diagnosis not present

## 2020-06-01 DIAGNOSIS — M5416 Radiculopathy, lumbar region: Secondary | ICD-10-CM | POA: Diagnosis not present

## 2020-06-05 DIAGNOSIS — M545 Low back pain: Secondary | ICD-10-CM | POA: Diagnosis not present

## 2020-06-07 DIAGNOSIS — M5416 Radiculopathy, lumbar region: Secondary | ICD-10-CM | POA: Diagnosis not present

## 2020-06-07 DIAGNOSIS — M47812 Spondylosis without myelopathy or radiculopathy, cervical region: Secondary | ICD-10-CM | POA: Diagnosis not present

## 2020-06-07 DIAGNOSIS — M545 Low back pain: Secondary | ICD-10-CM | POA: Diagnosis not present

## 2020-06-13 DIAGNOSIS — M545 Low back pain: Secondary | ICD-10-CM | POA: Diagnosis not present

## 2020-06-13 DIAGNOSIS — M5416 Radiculopathy, lumbar region: Secondary | ICD-10-CM | POA: Diagnosis not present

## 2020-06-13 DIAGNOSIS — M47812 Spondylosis without myelopathy or radiculopathy, cervical region: Secondary | ICD-10-CM | POA: Diagnosis not present

## 2020-07-13 DIAGNOSIS — M5416 Radiculopathy, lumbar region: Secondary | ICD-10-CM | POA: Diagnosis not present

## 2020-07-25 DIAGNOSIS — M5416 Radiculopathy, lumbar region: Secondary | ICD-10-CM | POA: Diagnosis not present

## 2020-07-31 DIAGNOSIS — M8588 Other specified disorders of bone density and structure, other site: Secondary | ICD-10-CM | POA: Diagnosis not present

## 2020-07-31 DIAGNOSIS — Z0000 Encounter for general adult medical examination without abnormal findings: Secondary | ICD-10-CM | POA: Diagnosis not present

## 2020-07-31 DIAGNOSIS — I2510 Atherosclerotic heart disease of native coronary artery without angina pectoris: Secondary | ICD-10-CM | POA: Diagnosis not present

## 2020-07-31 DIAGNOSIS — E559 Vitamin D deficiency, unspecified: Secondary | ICD-10-CM | POA: Diagnosis not present

## 2020-08-10 DIAGNOSIS — M5416 Radiculopathy, lumbar region: Secondary | ICD-10-CM | POA: Diagnosis not present

## 2020-10-17 DIAGNOSIS — Z1231 Encounter for screening mammogram for malignant neoplasm of breast: Secondary | ICD-10-CM | POA: Diagnosis not present

## 2020-11-07 DIAGNOSIS — M5416 Radiculopathy, lumbar region: Secondary | ICD-10-CM | POA: Diagnosis not present

## 2020-11-22 DIAGNOSIS — M5416 Radiculopathy, lumbar region: Secondary | ICD-10-CM | POA: Diagnosis not present

## 2020-11-28 DIAGNOSIS — M5116 Intervertebral disc disorders with radiculopathy, lumbar region: Secondary | ICD-10-CM | POA: Diagnosis not present

## 2020-12-07 DIAGNOSIS — M5116 Intervertebral disc disorders with radiculopathy, lumbar region: Secondary | ICD-10-CM | POA: Diagnosis not present

## 2020-12-12 DIAGNOSIS — M5116 Intervertebral disc disorders with radiculopathy, lumbar region: Secondary | ICD-10-CM | POA: Diagnosis not present

## 2020-12-14 DIAGNOSIS — M5116 Intervertebral disc disorders with radiculopathy, lumbar region: Secondary | ICD-10-CM | POA: Diagnosis not present

## 2020-12-15 DIAGNOSIS — M1990 Unspecified osteoarthritis, unspecified site: Secondary | ICD-10-CM | POA: Diagnosis not present

## 2020-12-15 DIAGNOSIS — M5430 Sciatica, unspecified side: Secondary | ICD-10-CM | POA: Diagnosis not present

## 2020-12-15 DIAGNOSIS — R911 Solitary pulmonary nodule: Secondary | ICD-10-CM | POA: Diagnosis not present

## 2020-12-15 DIAGNOSIS — K219 Gastro-esophageal reflux disease without esophagitis: Secondary | ICD-10-CM | POA: Diagnosis not present

## 2020-12-18 ENCOUNTER — Other Ambulatory Visit: Payer: Self-pay | Admitting: Family Medicine

## 2020-12-18 DIAGNOSIS — M5116 Intervertebral disc disorders with radiculopathy, lumbar region: Secondary | ICD-10-CM | POA: Diagnosis not present

## 2020-12-18 DIAGNOSIS — R911 Solitary pulmonary nodule: Secondary | ICD-10-CM

## 2020-12-25 ENCOUNTER — Ambulatory Visit
Admission: RE | Admit: 2020-12-25 | Discharge: 2020-12-25 | Disposition: A | Discharge status: Home / Self Care | Payer: Medicare Other | Source: Ambulatory Visit | Attending: Family Medicine | Admitting: Family Medicine

## 2020-12-25 ENCOUNTER — Other Ambulatory Visit: Payer: Self-pay

## 2020-12-25 DIAGNOSIS — R911 Solitary pulmonary nodule: Secondary | ICD-10-CM

## 2020-12-26 DIAGNOSIS — H16142 Punctate keratitis, left eye: Secondary | ICD-10-CM | POA: Diagnosis not present

## 2020-12-28 DIAGNOSIS — M5116 Intervertebral disc disorders with radiculopathy, lumbar region: Secondary | ICD-10-CM | POA: Diagnosis not present

## 2021-03-14 DIAGNOSIS — M5416 Radiculopathy, lumbar region: Secondary | ICD-10-CM | POA: Diagnosis not present

## 2021-03-14 DIAGNOSIS — R030 Elevated blood-pressure reading, without diagnosis of hypertension: Secondary | ICD-10-CM | POA: Diagnosis not present

## 2021-03-14 DIAGNOSIS — Z6825 Body mass index (BMI) 25.0-25.9, adult: Secondary | ICD-10-CM | POA: Diagnosis not present

## 2021-03-26 DIAGNOSIS — M5416 Radiculopathy, lumbar region: Secondary | ICD-10-CM | POA: Diagnosis not present

## 2021-03-26 DIAGNOSIS — M5116 Intervertebral disc disorders with radiculopathy, lumbar region: Secondary | ICD-10-CM | POA: Diagnosis not present

## 2021-04-18 DIAGNOSIS — M5416 Radiculopathy, lumbar region: Secondary | ICD-10-CM | POA: Diagnosis not present

## 2021-06-22 IMAGING — CT CT CHEST W/O CM
1 series · 15 of 34 positions shown, 19 images · non-contrast
Comparison: CT chest 07/28/2018

CLINICAL DATA: F/u lung nodules, Cancer-none, Breast implants

EXAM:
CT CHEST WITHOUT CONTRAST
TECHNIQUE: Multidetector CT imaging of the chest was performed following the
standard protocol without IV contrast.

[Series 2: chest w/(date) · axial · 0.71mm/px · z∈[-287,-7]mm · 15 of 166 slices shown, 19 images]
[im 13/166  mediastinal]
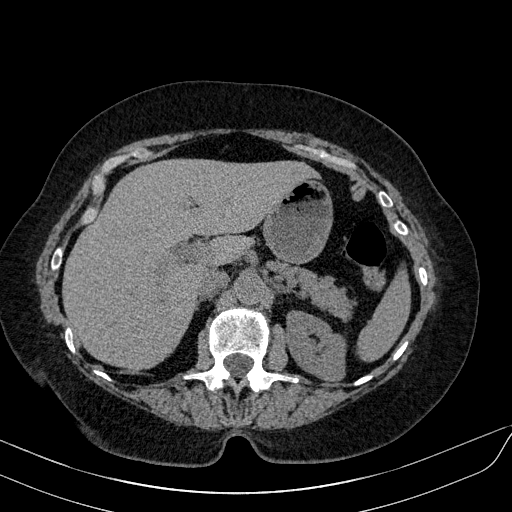
[im 13/166  lung]
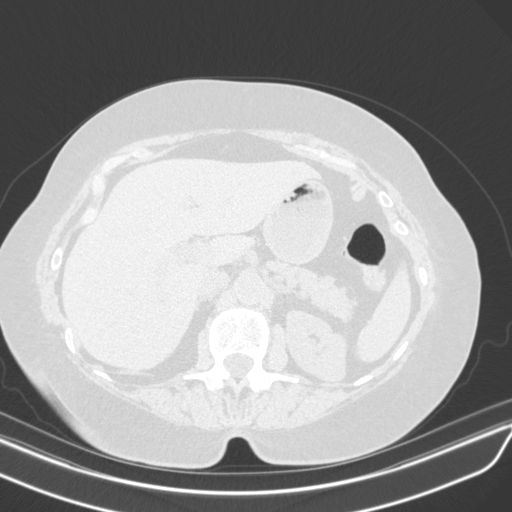
[im 25/166  lung]
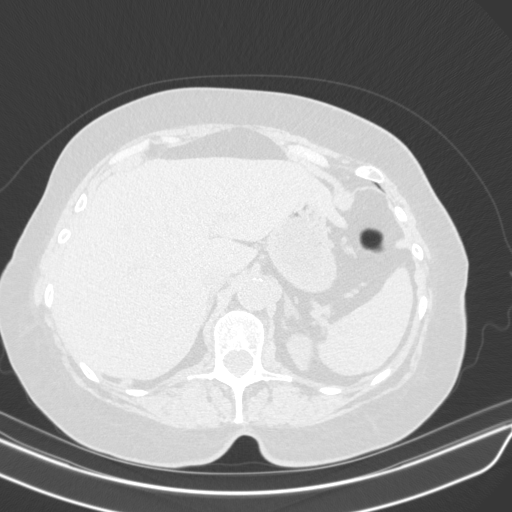
[im 34/166  lung]
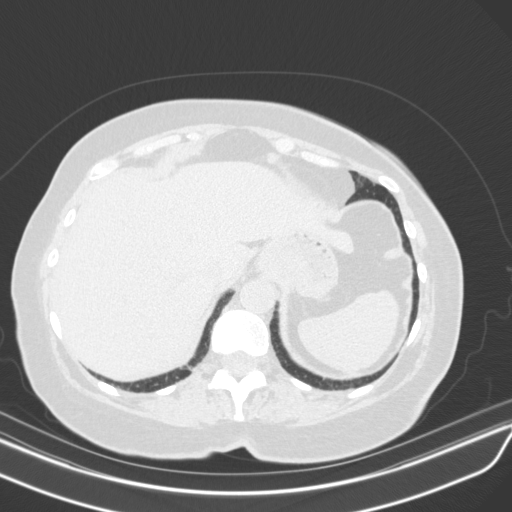
[im 43/166  lung]
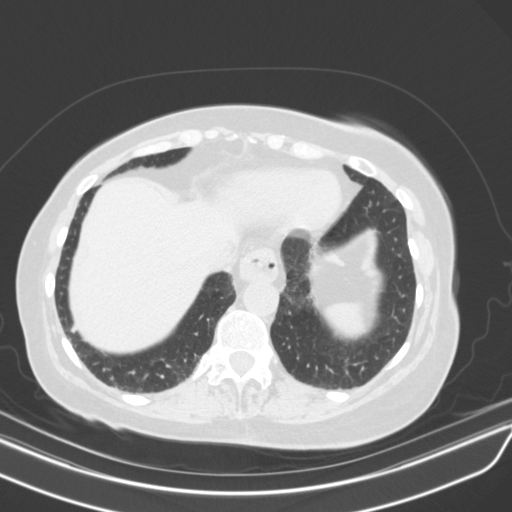
[im 56/166  mediastinal]
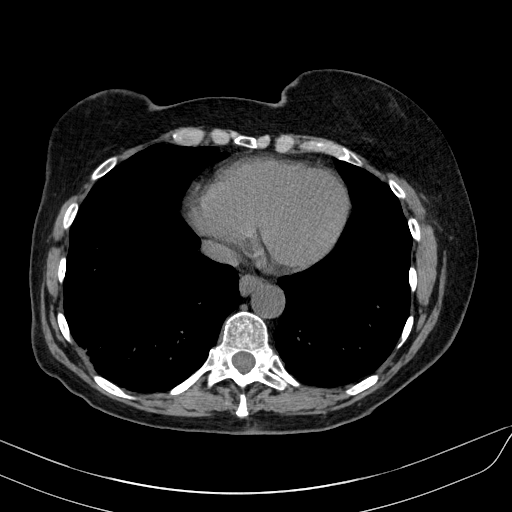
[im 56/166  lung]
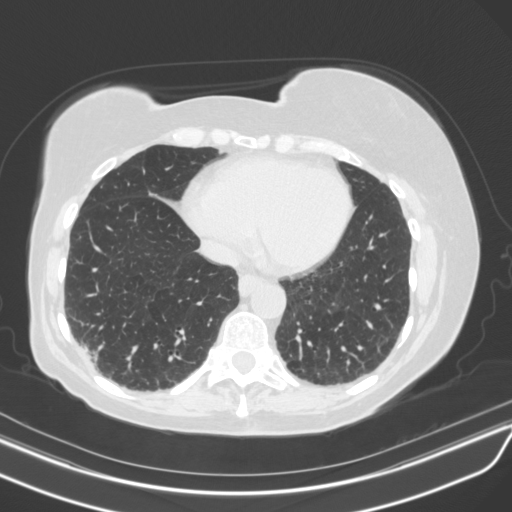
[im 67/166  lung]
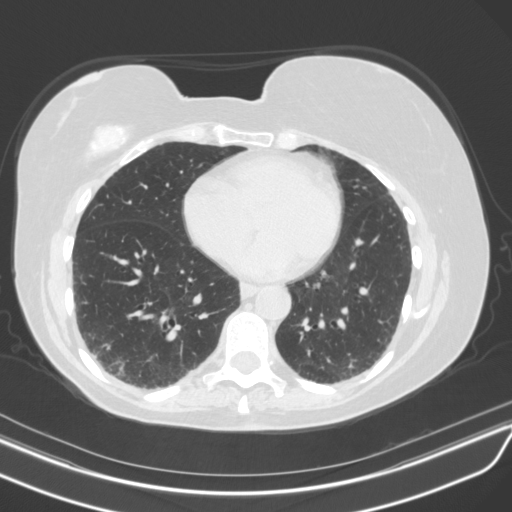
[im 74/166  lung]
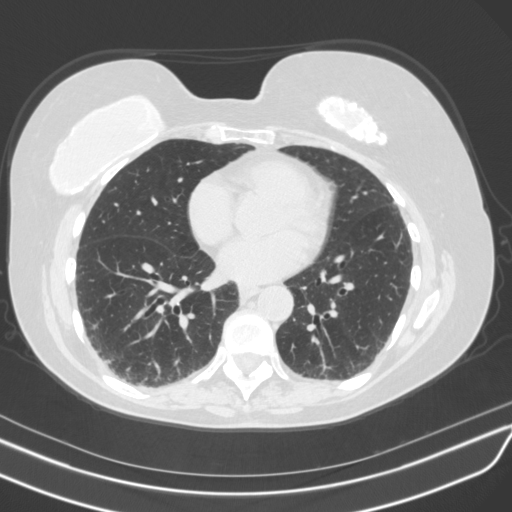
[im 86/166  lung]
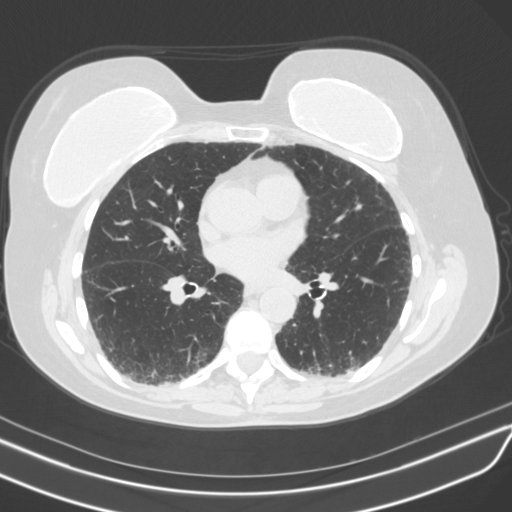
[im 92/166  mediastinal]
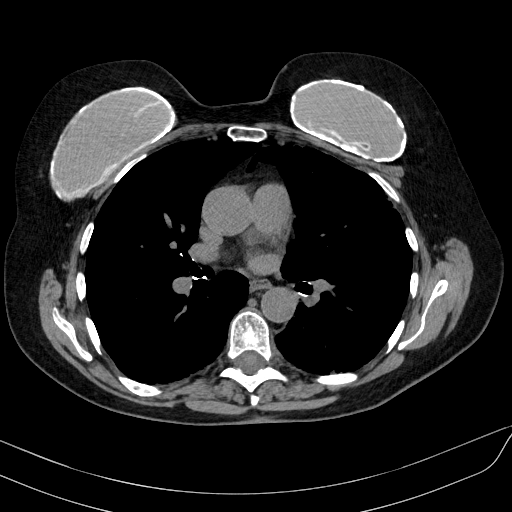
[im 92/166  lung]
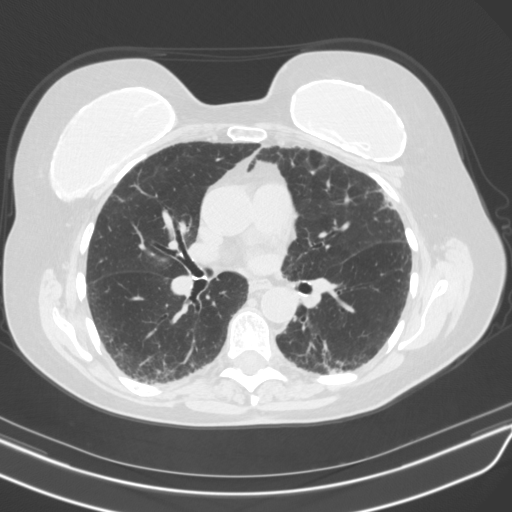
[im 100/166  lung]
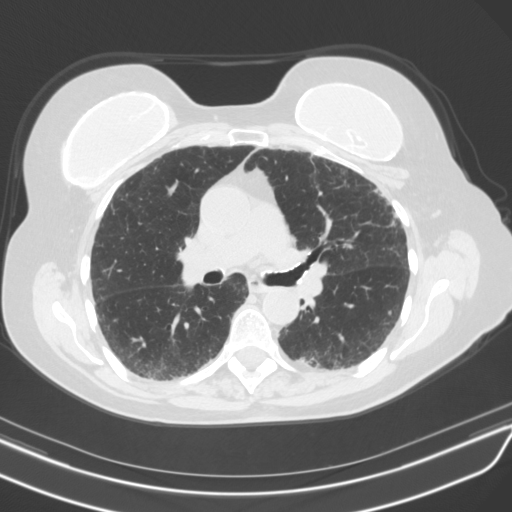
[im 111/166  lung]
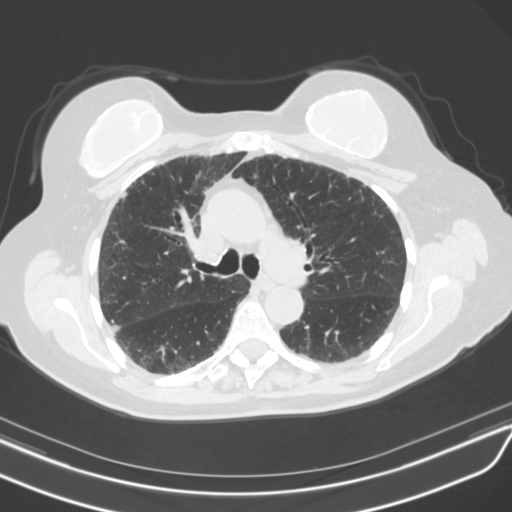
[im 123/166  lung]
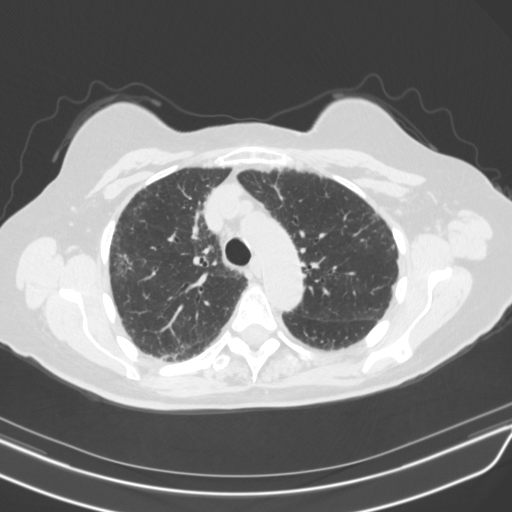
[im 133/166  mediastinal]
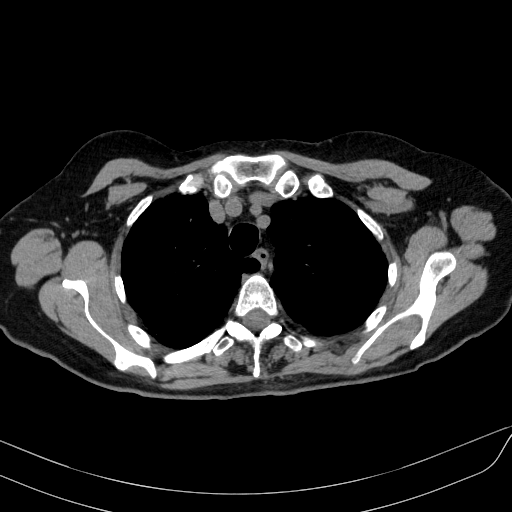
[im 133/166  lung]
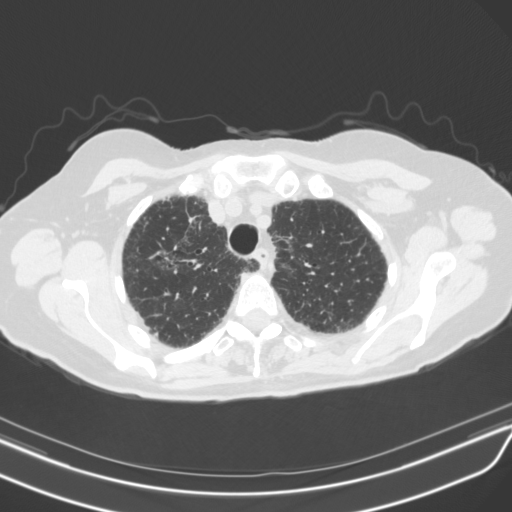
[im 141/166  lung]
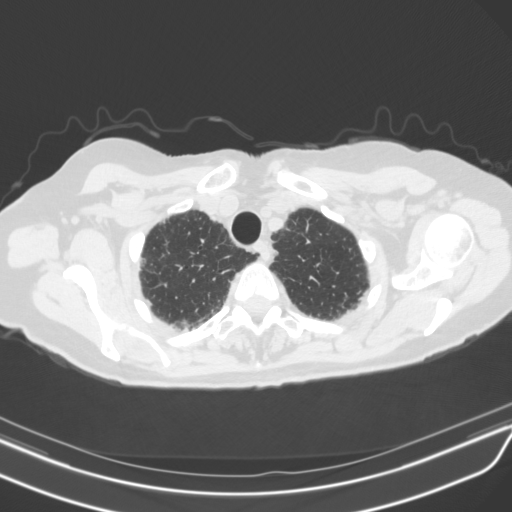
[im 153/166  lung]
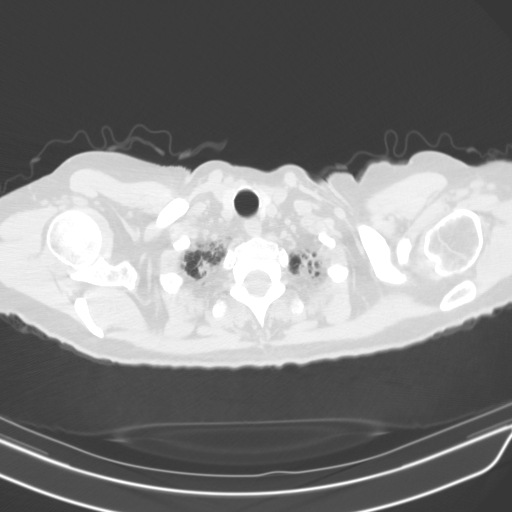

[15 of 34 positions shown; findings below may reference images not displayed]

FINDINGS: Cardiovascular: Normal heart size. No significant pericardial
effusion. The thoracic aorta is normal in caliber. Mild
atherosclerotic plaque of the thoracic aorta. Mild coronary artery
calcifications.

Mediastinum/Nodes: No enlarged mediastinal or axillary lymph nodes.
No gross hilar adenopathy, noting limited sensitivity for the
detection of hilar adenopathy on this noncontrast study. Thyroid
gland, trachea, and esophagus demonstrate no significant findings.
Tiny hiatal hernia.

Lungs/Pleura: Centrilobular emphysematous changes. Biapical
pleural/pulmonary scarring. Similar appearing trace reticulations
peripherally. Stable 4mm right upper lobe paramediastinal nodule.
Stable 4mm left upper lobe nodule. Stable 6mm left lingular nodules.
No new pulmonary nodules. No pulmonary mass. Calcified pulmonary
nodule again noted at the right base. Noplueral effusions. No
pneumothorax.

Upper Abdomen: No acute abnormality.

Musculoskeletal: Similar appearing bilateral breast implants. No
chest wall mass or suspicious bone lesions identified.
IMPRESSION: 1. Stable 4-6mm pulmonary nodules. No new pulmonary nodules. No
further follow-up indicated.
2. No acute intrathoracic abnormality in a patient with interstitial
lung disease and emphysema.

## 2021-06-26 DIAGNOSIS — M5416 Radiculopathy, lumbar region: Secondary | ICD-10-CM | POA: Diagnosis not present

## 2021-07-30 DIAGNOSIS — M9903 Segmental and somatic dysfunction of lumbar region: Secondary | ICD-10-CM | POA: Diagnosis not present

## 2021-07-30 DIAGNOSIS — M5136 Other intervertebral disc degeneration, lumbar region: Secondary | ICD-10-CM | POA: Diagnosis not present

## 2021-07-30 DIAGNOSIS — M9905 Segmental and somatic dysfunction of pelvic region: Secondary | ICD-10-CM | POA: Diagnosis not present

## 2021-07-30 DIAGNOSIS — M9904 Segmental and somatic dysfunction of sacral region: Secondary | ICD-10-CM | POA: Diagnosis not present

## 2021-07-31 DIAGNOSIS — M9903 Segmental and somatic dysfunction of lumbar region: Secondary | ICD-10-CM | POA: Diagnosis not present

## 2021-07-31 DIAGNOSIS — M5136 Other intervertebral disc degeneration, lumbar region: Secondary | ICD-10-CM | POA: Diagnosis not present

## 2021-07-31 DIAGNOSIS — M9904 Segmental and somatic dysfunction of sacral region: Secondary | ICD-10-CM | POA: Diagnosis not present

## 2021-07-31 DIAGNOSIS — M9905 Segmental and somatic dysfunction of pelvic region: Secondary | ICD-10-CM | POA: Diagnosis not present

## 2021-08-01 DIAGNOSIS — E559 Vitamin D deficiency, unspecified: Secondary | ICD-10-CM | POA: Diagnosis not present

## 2021-08-01 DIAGNOSIS — M9905 Segmental and somatic dysfunction of pelvic region: Secondary | ICD-10-CM | POA: Diagnosis not present

## 2021-08-01 DIAGNOSIS — Z0000 Encounter for general adult medical examination without abnormal findings: Secondary | ICD-10-CM | POA: Diagnosis not present

## 2021-08-01 DIAGNOSIS — M1990 Unspecified osteoarthritis, unspecified site: Secondary | ICD-10-CM | POA: Diagnosis not present

## 2021-08-01 DIAGNOSIS — I2510 Atherosclerotic heart disease of native coronary artery without angina pectoris: Secondary | ICD-10-CM | POA: Diagnosis not present

## 2021-08-01 DIAGNOSIS — M9903 Segmental and somatic dysfunction of lumbar region: Secondary | ICD-10-CM | POA: Diagnosis not present

## 2021-08-01 DIAGNOSIS — G479 Sleep disorder, unspecified: Secondary | ICD-10-CM | POA: Diagnosis not present

## 2021-08-01 DIAGNOSIS — M5136 Other intervertebral disc degeneration, lumbar region: Secondary | ICD-10-CM | POA: Diagnosis not present

## 2021-08-01 DIAGNOSIS — M9904 Segmental and somatic dysfunction of sacral region: Secondary | ICD-10-CM | POA: Diagnosis not present

## 2021-08-01 DIAGNOSIS — E7800 Pure hypercholesterolemia, unspecified: Secondary | ICD-10-CM | POA: Diagnosis not present

## 2021-08-01 DIAGNOSIS — M8588 Other specified disorders of bone density and structure, other site: Secondary | ICD-10-CM | POA: Diagnosis not present

## 2021-10-23 DIAGNOSIS — Z1231 Encounter for screening mammogram for malignant neoplasm of breast: Secondary | ICD-10-CM | POA: Diagnosis not present

## 2021-11-21 DIAGNOSIS — M25552 Pain in left hip: Secondary | ICD-10-CM | POA: Diagnosis not present

## 2021-11-22 DIAGNOSIS — M1612 Unilateral primary osteoarthritis, left hip: Secondary | ICD-10-CM | POA: Diagnosis not present

## 2021-11-22 DIAGNOSIS — M25552 Pain in left hip: Secondary | ICD-10-CM | POA: Diagnosis not present

## 2021-11-22 DIAGNOSIS — M7602 Gluteal tendinitis, left hip: Secondary | ICD-10-CM | POA: Diagnosis not present

## 2021-11-28 DIAGNOSIS — M25552 Pain in left hip: Secondary | ICD-10-CM | POA: Diagnosis not present

## 2021-12-03 DIAGNOSIS — M25552 Pain in left hip: Secondary | ICD-10-CM | POA: Diagnosis not present

## 2022-01-04 DIAGNOSIS — M25552 Pain in left hip: Secondary | ICD-10-CM | POA: Diagnosis not present

## 2022-01-08 DIAGNOSIS — M25552 Pain in left hip: Secondary | ICD-10-CM | POA: Diagnosis not present

## 2022-08-02 DIAGNOSIS — M1990 Unspecified osteoarthritis, unspecified site: Secondary | ICD-10-CM | POA: Diagnosis not present

## 2022-08-02 DIAGNOSIS — G479 Sleep disorder, unspecified: Secondary | ICD-10-CM | POA: Diagnosis not present

## 2022-08-02 DIAGNOSIS — I2510 Atherosclerotic heart disease of native coronary artery without angina pectoris: Secondary | ICD-10-CM | POA: Diagnosis not present

## 2022-08-02 DIAGNOSIS — Z0000 Encounter for general adult medical examination without abnormal findings: Secondary | ICD-10-CM | POA: Diagnosis not present

## 2022-08-03 ENCOUNTER — Encounter (HOSPITAL_COMMUNITY): Payer: Self-pay

## 2022-08-03 ENCOUNTER — Other Ambulatory Visit: Payer: Self-pay

## 2022-08-03 ENCOUNTER — Emergency Department (HOSPITAL_COMMUNITY)
Admission: EM | Admit: 2022-08-03 | Discharge: 2022-08-03 | Disposition: A | Discharge status: Home / Self Care | Payer: Medicare Other | Attending: Emergency Medicine | Admitting: Emergency Medicine

## 2022-08-03 ENCOUNTER — Emergency Department (HOSPITAL_COMMUNITY): Payer: Medicare Other

## 2022-08-03 DIAGNOSIS — R519 Headache, unspecified: Secondary | ICD-10-CM | POA: Insufficient documentation

## 2022-08-03 DIAGNOSIS — G319 Degenerative disease of nervous system, unspecified: Secondary | ICD-10-CM | POA: Diagnosis not present

## 2022-08-03 DIAGNOSIS — I10 Essential (primary) hypertension: Secondary | ICD-10-CM | POA: Diagnosis not present

## 2022-08-03 DIAGNOSIS — H547 Unspecified visual loss: Secondary | ICD-10-CM | POA: Insufficient documentation

## 2022-08-03 DIAGNOSIS — Z20822 Contact with and (suspected) exposure to covid-19: Secondary | ICD-10-CM | POA: Insufficient documentation

## 2022-08-03 DIAGNOSIS — H3322 Serous retinal detachment, left eye: Secondary | ICD-10-CM

## 2022-08-03 LAB — DIFFERENTIAL
Abs Immature Granulocytes: 0.01 10*3/uL (ref 0.00–0.07)
Basophils Absolute: 0 10*3/uL (ref 0.0–0.1)
Basophils Relative: 1 %
Eosinophils Absolute: 0.1 10*3/uL (ref 0.0–0.5)
Eosinophils Relative: 1 %
Immature Granulocytes: 0 %
Lymphocytes Relative: 28 %
Lymphs Abs: 1.7 10*3/uL (ref 0.7–4.0)
Monocytes Absolute: 0.5 10*3/uL (ref 0.1–1.0)
Monocytes Relative: 8 %
Neutro Abs: 4 10*3/uL (ref 1.7–7.7)
Neutrophils Relative %: 62 %

## 2022-08-03 LAB — RAPID URINE DRUG SCREEN, HOSP PERFORMED
Amphetamines: NOT DETECTED
Barbiturates: NOT DETECTED
Benzodiazepines: NOT DETECTED
Cocaine: NOT DETECTED
Opiates: NOT DETECTED
Tetrahydrocannabinol: NOT DETECTED

## 2022-08-03 LAB — I-STAT CHEM 8, ED
BUN: 15 mg/dL (ref 8–23)
Calcium, Ion: 1.24 mmol/L (ref 1.15–1.40)
Chloride: 101 mmol/L (ref 98–111)
Creatinine, Ser: 0.7 mg/dL (ref 0.44–1.00)
Glucose, Bld: 88 mg/dL (ref 70–99)
HCT: 42 % (ref 36.0–46.0)
Hemoglobin: 14.3 g/dL (ref 12.0–15.0)
Potassium: 3.8 mmol/L (ref 3.5–5.1)
Sodium: 140 mmol/L (ref 135–145)
TCO2: 30 mmol/L (ref 22–32)

## 2022-08-03 LAB — COMPREHENSIVE METABOLIC PANEL
ALT: 19 U/L (ref 0–44)
AST: 22 U/L (ref 15–41)
Albumin: 4.2 g/dL (ref 3.5–5.0)
Alkaline Phosphatase: 37 U/L — ABNORMAL LOW (ref 38–126)
Anion gap: 9 (ref 5–15)
BUN: 13 mg/dL (ref 8–23)
CO2: 27 mmol/L (ref 22–32)
Calcium: 9.9 mg/dL (ref 8.9–10.3)
Chloride: 103 mmol/L (ref 98–111)
Creatinine, Ser: 0.69 mg/dL (ref 0.44–1.00)
GFR, Estimated: >60 mL/min (ref >60)
Glucose, Bld: 93 mg/dL (ref 70–99)
Potassium: 3.9 mmol/L (ref 3.5–5.1)
Sodium: 139 mmol/L (ref 135–145)
Total Bilirubin: 0.6 mg/dL (ref 0.3–1.2)
Total Protein: 6.8 g/dL (ref 6.5–8.1)

## 2022-08-03 LAB — CBC
HCT: 43.2 % (ref 36.0–46.0)
Hemoglobin: 13.9 g/dL (ref 12.0–15.0)
MCH: 29.4 pg (ref 26.0–34.0)
MCHC: 32.2 g/dL (ref 30.0–36.0)
MCV: 91.5 fL (ref 80.0–100.0)
Platelets: 292 10*3/uL (ref 150–400)
RBC: 4.72 MIL/uL (ref 3.87–5.11)
RDW: 12 % (ref 11.5–15.5)
WBC: 6.3 10*3/uL (ref 4.0–10.5)
nRBC: 0 % (ref 0.0–0.2)

## 2022-08-03 LAB — URINALYSIS, ROUTINE W REFLEX MICROSCOPIC
Bilirubin Urine: NEGATIVE
Glucose, UA: NEGATIVE mg/dL
Hgb urine dipstick: NEGATIVE
Ketones, ur: NEGATIVE mg/dL
Leukocytes,Ua: NEGATIVE
Nitrite: NEGATIVE
Protein, ur: NEGATIVE mg/dL
Specific Gravity, Urine: 1.008 (ref 1.005–1.030)
pH: 6 (ref 5.0–8.0)

## 2022-08-03 LAB — RESP PANEL BY RT-PCR (FLU A&B, COVID) ARPGX2
Influenza A by PCR: NEGATIVE
Influenza B by PCR: NEGATIVE
SARS Coronavirus 2 by RT PCR: NEGATIVE

## 2022-08-03 NOTE — Discharge Instructions (Signed)
You have been seen today for your complaint of headache and left-sided vision loss. Your lab work was reassuring and showed no abnormalities. Your MRI showed no abnormalities.  Your ultrasound did show signs of retinal detachment. Home care instructions are as follows:  You should lay back in bed is much as possible.  You should call ophthalmologist Dr. Valetta Close.  I have given you his office contact.  You should call them as early as you can on Monday morning. Follow up with: Dr. Valetta Close as listed above. Please seek immediate medical care if you develop any of the following symptoms: You suddenly see flashing lights or floaters. You suddenly have a dark area in your field of vision, especially in the lower part. This can lead to a loss of central vision. You have sudden loss of vision. At this time there does not appear to be the presence of an emergent medical condition, however there is always the potential for conditions to change. Please read and follow the below instructions.  Do not take your medicine if  develop an itchy rash, swelling in your mouth or lips, or difficulty breathing; call 911 and seek immediate emergency medical attention if this occurs.  You may review your lab tests and imaging results in their entirety on your MyChart account.  Please discuss all results of fully with your primary care provider and other specialist at your follow-up visit.  Note: Portions of this text may have been transcribed using voice recognition software. Every effort was made to ensure accuracy; however, inadvertent computerized transcription errors may still be present.

## 2022-08-03 NOTE — ED Triage Notes (Signed)
Pt states she woke up this morning with a headache, she states around 1200 she saw a bright light in her left eye and then it went black and she lost vision in her left eye. Pt states her vision returned after about 30 mins. Pt is alert and oriented x4. Pt denies any associated symptoms. Pt is hypertensive. No focal neuro deficits noted. Denies use of blood thinners.

## 2022-08-03 NOTE — ED Provider Notes (Signed)
[facility] EMERGENCY DEPARTMENT Provider Note   CSN: [id number] Arrival date & time: 08/03/22  1535     History  Chief Complaint  Patient presents with   Headache   Eye Problem    Glenda Hunt is a 76 y.o. female.  With history of arthritis, lens implants who presents to the ED for evaluation of headache and left-sided vision loss.  She reported waking up this morning at approximately 8 AM with a sharp feeling headache that encompassed the superior aspect of her head.  States it is not the worst headache she has ever had.  Reports she took a Tylenol and headache improved.  States she no longer has a headache.  She also complained of left-sided vision loss.  She states that approximately noon today she stood up from the toilet and noticed left-sided bright lights.  This quickly resolved, approximately 20 minutes after resolution she reportedly lost all vision in her left eye and felt as though someone pulled a curtain down on that eye.  She states this lasted for approximately 30 minutes, and then had full return of her vision after that.  She called her eye doctor who then strongly urged her to be seen in the ED for evaluation of a possible stroke.  Patient is asymptomatic on my evaluation, states that she has full vision in both eyes and her headache is now gone.  Denied dizziness, lightheadedness, numbness, weakness, tingling, slurred speech, confusion, falls, nausea, chest pain, shortness of breath.   Headache Eye Problem Associated symptoms: headaches        Home Medications Prior to Admission medications   Medication Sig Start Date End Date Taking? Authorizing Provider  Ascorbic Acid (VITAMIN C) 1000 MG tablet Take 2,000 mg by mouth daily.    [provider]  calcium-vitamin D (OSCAL WITH D) 500-200 MG-UNIT per tablet Take 1 tablet by mouth daily.    [provider]  Multiple Vitamin (MULTIVITAMIN WITH MINERALS) TABS Take 1 tablet by mouth  daily.    [provider]  Naproxen Sodium (ALEVE PO) Take by mouth as needed.    [provider]      Allergies    Codeine, Other, and Penicillins    Review of Systems   Review of Systems  Eyes:  Positive for visual disturbance.  Neurological:  Positive for headaches.  All other systems reviewed and are negative.   Physical Exam Updated Vital Signs BP (!) 188/87 (BP Location: Right Arm)   Pulse 65   Temp 98.3 F (36.8 C) (Oral)   Resp 14   Ht '5\' 6"'$  (1.676 m)   Wt 70.3 kg   SpO2 100%   BMI 25.02 kg/m  Physical Exam Vitals and nursing note reviewed.  Constitutional:      General: She is not in acute distress.    Appearance: Normal appearance. She is well-developed and normal weight. She is not ill-appearing.  HENT:     Head: Normocephalic and atraumatic.  Eyes:     Extraocular Movements: Extraocular movements intact.     Right eye: No nystagmus.     Left eye: No nystagmus.     Pupils: Pupils are equal, round, and reactive to light. Pupils are equal.  Cardiovascular:     Rate and Rhythm: Normal rate and regular rhythm.     Heart sounds: Normal heart sounds. No murmur heard. Pulmonary:     Effort: Pulmonary effort is normal. No respiratory distress.     Breath  sounds: Normal breath sounds.  Abdominal:     General: Abdomen is flat.  Musculoskeletal:        General: Normal range of motion.     Cervical back: Normal range of motion and neck supple.  Skin:    General: Skin is warm and dry.  Neurological:     Mental Status: She is alert and oriented to person, place, and time.     GCS: GCS eye subscore is 4. GCS verbal subscore is 5. GCS motor subscore is 6.     Comments: No pronator drift, facial asymmetry, slurred speech, unilateral or global weakness.  Heel-to-shin normal.  Psychiatric:        Mood and Affect: Mood normal.        Behavior: Behavior normal.     ED Results / Procedures / Treatments   Labs (all labs ordered are listed, but only  abnormal results are displayed) Labs Reviewed  COMPREHENSIVE METABOLIC PANEL - Abnormal; Notable for the following components:      Result Value   Alkaline Phosphatase 37 (*)    All other components within normal limits  URINALYSIS, ROUTINE W REFLEX MICROSCOPIC - Abnormal; Notable for the following components:   Color, Urine STRAW (*)    All other components within normal limits  RESP PANEL BY RT-PCR (FLU A&B, COVID) ARPGX2  CBC  DIFFERENTIAL  RAPID URINE DRUG SCREEN, HOSP PERFORMED  ETHANOL  PROTIME-INR  APTT  I-STAT CHEM 8, ED    EKG None  Radiology No results found.  Procedures Procedures    Medications Ordered in ED Medications - No data to display  ED Course/ Medical Decision Making/ A&P Clinical Course as of 08/03/22 2044  Sat Aug 03, 2022  1809 Spoke with radiologist, they recommend MRI brain for evaluation of possible occipital stroke causing vision loss [AS]  2042 MR BRAIN WO CONTRAST I personally reviewed the image.  MRI showed no acute abnormality of the brain to describe patient's symptoms. [AS]    Clinical Course User Index [AS] Yassine Brunsman, G[place]on Folk, PA-C                           Medical Decision Making Amount and/or Complexity of Data Reviewed Radiology: ordered. Decision-making details documented in ED Course.  This patient presents to the ED for concern of headache and left-sided vision loss, this involves an extensive number of treatment options, and is a complaint that carries with it a high risk of complications and morbidity.  The differential diagnosis includes ischemic or hemorrhagic stroke, retinal detachment, central artery or central vein occlusion   My initial workup includes stroke work-up labs, MRI brain  Additional history obtained from: Nursing notes from this visit. Previous records within EMR system office visit on 06/26/2021 for evaluation of elevated blood pressure without diagnosis of hypertension  I ordered, reviewed and  interpreted labs which include: Urine drug screen, urinalysis, CMP, CBC, respiratory panel.  All labs within normal limits.   I ordered imaging studies including MRI brain. I independently visualized and interpreted imaging which showed no acute intracranial abnormalities I agree with the radiologist interpretation  Bedside ocular ultrasound performed by Dr. Pearline Cables, did show retinal detachment  Cardiac Monitoring:  The patient was maintained on a cardiac monitor.  I personally viewed and interpreted the cardiac monitored which showed an underlying rhythm of: NSR  Afebrile, hemodynamically stable.  Patient is 76 year old female with no significant past medical history presents ED for evaluation  of acute headache that has since resolved and brief vision loss in the left eye.  Describes the vision loss as a curtain being pulled down over her eye.  Vision has since returned to normal.  Neurological exam unremarkable.  Labs unremarkable.  MRI brain shows no acute intracranial abnormalities to describe her symptoms.  Bedside ultrasound did show retinal detachment.  Ordered ambulatory referral to ophthalmology and gave patient contact information for on call ophthalmologist Dr. Valetta Close.  Patient did call her primary ophthalmologist while in the ED who reportedly told her that he will see her in office tonight.  Did inform patient that she may still contact the ophthalmologist in her discharge paperwork.  Gave patient return precautions.  Stable at discharge.  At this time there does not appear to be any evidence of an acute emergency medical condition and the patient appears stable for discharge with appropriate outpatient follow up. Diagnosis was discussed with patient who verbalizes understanding of care plan and is agreeable to discharge. I have discussed return precautions with patient who verbalizes understanding. Patient encouraged to follow-up with their PCP within 1 week. All questions  answered.  Patient's case discussed with Dr. Pearline Cables who agrees with plan to discharge with follow-up.   Note: Portions of this report may have been transcribed using voice recognition software. Every effort was made to ensure accuracy; however, inadvertent computerized transcription errors may still be present.          Final Clinical Impression(s) / ED Diagnoses Final diagnoses:  None    Rx / DC Orders ED Discharge Orders     None         Roylene Reason, Hershal Coria 87/56/43 [id number]    Campbell Stall P, DO 18/84/16 [id number]

## 2022-08-03 NOTE — ED Notes (Signed)
THE PT HAD A SUDDEN LOSS OF VISION IN HER LT EYE THAT LASTED APPROX 30 MINUTES  THEN HER VISION RETURNED AND NO PAIN DURING THAT TIME

## 2022-08-03 NOTE — ED Notes (Signed)
Passed swallow screen

## 2022-08-09 DIAGNOSIS — E7800 Pure hypercholesterolemia, unspecified: Secondary | ICD-10-CM | POA: Diagnosis not present

## 2022-08-09 DIAGNOSIS — R519 Headache, unspecified: Secondary | ICD-10-CM | POA: Diagnosis not present

## 2022-08-09 DIAGNOSIS — Z0000 Encounter for general adult medical examination without abnormal findings: Secondary | ICD-10-CM | POA: Diagnosis not present

## 2022-08-09 DIAGNOSIS — I2510 Atherosclerotic heart disease of native coronary artery without angina pectoris: Secondary | ICD-10-CM | POA: Diagnosis not present

## 2022-08-09 DIAGNOSIS — K219 Gastro-esophageal reflux disease without esophagitis: Secondary | ICD-10-CM | POA: Diagnosis not present

## 2022-10-24 DIAGNOSIS — Z1231 Encounter for screening mammogram for malignant neoplasm of breast: Secondary | ICD-10-CM | POA: Diagnosis not present

## 2022-10-24 DIAGNOSIS — Z0389 Encounter for observation for other suspected diseases and conditions ruled out: Secondary | ICD-10-CM | POA: Diagnosis not present

## 2023-02-06 DIAGNOSIS — K080 Exfoliation of teeth due to systemic causes: Secondary | ICD-10-CM | POA: Diagnosis not present

## 2023-03-05 DIAGNOSIS — K080 Exfoliation of teeth due to systemic causes: Secondary | ICD-10-CM | POA: Diagnosis not present

## 2023-05-19 DIAGNOSIS — K080 Exfoliation of teeth due to systemic causes: Secondary | ICD-10-CM | POA: Diagnosis not present

## 2023-06-24 DIAGNOSIS — K080 Exfoliation of teeth due to systemic causes: Secondary | ICD-10-CM | POA: Diagnosis not present

## 2023-07-01 DIAGNOSIS — K080 Exfoliation of teeth due to systemic causes: Secondary | ICD-10-CM | POA: Diagnosis not present

## 2023-08-05 DIAGNOSIS — H43813 Vitreous degeneration, bilateral: Secondary | ICD-10-CM | POA: Diagnosis not present

## 2023-08-05 DIAGNOSIS — Z961 Presence of intraocular lens: Secondary | ICD-10-CM | POA: Diagnosis not present

## 2023-08-05 DIAGNOSIS — G453 Amaurosis fugax: Secondary | ICD-10-CM | POA: Diagnosis not present

## 2023-09-10 DIAGNOSIS — M1990 Unspecified osteoarthritis, unspecified site: Secondary | ICD-10-CM | POA: Diagnosis not present

## 2023-09-10 DIAGNOSIS — I700 Atherosclerosis of aorta: Secondary | ICD-10-CM | POA: Diagnosis not present

## 2023-09-10 DIAGNOSIS — J439 Emphysema, unspecified: Secondary | ICD-10-CM | POA: Diagnosis not present

## 2023-09-10 DIAGNOSIS — E7800 Pure hypercholesterolemia, unspecified: Secondary | ICD-10-CM | POA: Diagnosis not present

## 2023-09-10 DIAGNOSIS — Z0000 Encounter for general adult medical examination without abnormal findings: Secondary | ICD-10-CM | POA: Diagnosis not present

## 2023-09-10 DIAGNOSIS — Z23 Encounter for immunization: Secondary | ICD-10-CM | POA: Diagnosis not present

## 2023-09-22 DIAGNOSIS — Z1211 Encounter for screening for malignant neoplasm of colon: Secondary | ICD-10-CM | POA: Diagnosis not present

## 2023-10-27 DIAGNOSIS — R140 Abdominal distension (gaseous): Secondary | ICD-10-CM | POA: Diagnosis not present

## 2023-10-27 DIAGNOSIS — K219 Gastro-esophageal reflux disease without esophagitis: Secondary | ICD-10-CM | POA: Diagnosis not present

## 2023-11-25 DIAGNOSIS — Z1231 Encounter for screening mammogram for malignant neoplasm of breast: Secondary | ICD-10-CM | POA: Diagnosis not present

## 2023-12-17 DIAGNOSIS — R140 Abdominal distension (gaseous): Secondary | ICD-10-CM | POA: Diagnosis not present

## 2023-12-17 DIAGNOSIS — K219 Gastro-esophageal reflux disease without esophagitis: Secondary | ICD-10-CM | POA: Diagnosis not present

## 2023-12-24 DIAGNOSIS — K080 Exfoliation of teeth due to systemic causes: Secondary | ICD-10-CM | POA: Diagnosis not present

## 2024-01-20 DIAGNOSIS — K219 Gastro-esophageal reflux disease without esophagitis: Secondary | ICD-10-CM | POA: Diagnosis not present

## 2024-01-20 DIAGNOSIS — R140 Abdominal distension (gaseous): Secondary | ICD-10-CM | POA: Diagnosis not present

## 2024-02-10 DIAGNOSIS — J988 Other specified respiratory disorders: Secondary | ICD-10-CM | POA: Diagnosis not present

## 2024-02-10 DIAGNOSIS — J439 Emphysema, unspecified: Secondary | ICD-10-CM | POA: Diagnosis not present

## 2024-05-17 DIAGNOSIS — K080 Exfoliation of teeth due to systemic causes: Secondary | ICD-10-CM | POA: Diagnosis not present

## 2024-06-03 DIAGNOSIS — I4510 Unspecified right bundle-branch block: Secondary | ICD-10-CM | POA: Diagnosis not present

## 2024-06-03 DIAGNOSIS — I2510 Atherosclerotic heart disease of native coronary artery without angina pectoris: Secondary | ICD-10-CM | POA: Diagnosis not present

## 2024-06-03 DIAGNOSIS — J439 Emphysema, unspecified: Secondary | ICD-10-CM | POA: Diagnosis not present

## 2024-06-03 DIAGNOSIS — R002 Palpitations: Secondary | ICD-10-CM | POA: Diagnosis not present

## 2024-06-12 NOTE — Progress Notes (Unsigned)
 Cardiology Clinic Note   Date: 06/14/2024 ID: ALLYE HOYOS, DOB 08-Dec-1945, MRN [id number]  Primary Care Provider: Vernon Velna JONELLE, MD   Primary Cardiologist:  None  Chief Complaint   Glenda Hunt is a 78 y.o. female who presents to the clinic today for new patient visit for palpitations.   Patient Profile      Past medical history significant for: Coronary artery calcification seen on CT. CT cardiac scoring 08/05/2016: Calcium score 1.5. Nuclear stress test 08/05/2016: Low risk study.  LVEF mildly decreased (45 to 54%).  Hypertensive response to exercise.  ST segment depression was noted during stress in the leads II and III beginning at 5 minutes of stress and returning to baseline after less than 1 minute of recovery.  Clinically negative, electrically positive.  Nuclear scan with normal perfusion. Palpitations.  In summary, patient was evaluated by Dr. Shlomo in September 2017 for palpitations.  She reported feeling her heart beating faster when laying down at night but no irregularity or skipping.  She also noted fatigue making it harder for her to walk.  She reported a family history of CAD.  She underwent CT cardiac scoring and stress testing as above.  Due to her hypertensive response to exercise she underwent 24-hour BP monitoring which showed average BP 114/67.  She was not seen again in follow-up.  Patient was evaluated by PCP on 06/03/2024 with complaints of palpitations.  She reported her smart watch alerting her at 5 AM and then again shortly after.  It appears a ZIO was placed and lab work was done.     History of Present Illness    Today, patient is her alone. She reports a couple of weeks ago she got up in the early morning hours to use the bathroom and was alerted by her Fit Bit that her heart rate was high. She did feel as though her heart was racing. She was then alerted again. She reports heart rate ranges were from 40-169 and 36-174. She has not had any further  episodes. She is currently wearing a 14 day Zio and has a few more days before she mails it back. She denies chest pain. She does report feeling more fatigued and easily winded with walking. She used to walk for exercise regularly until a couple of years ago when she injured her back. She was receiving injections and it improved but then she began caring for her older brother and that takes up a lot of her time. She reports being under a lot of stress and having difficulty sleeping. She has a family history of afib in 2 of her brothers, her other brother passed away from MI at age 78, her mom had angina, and her dad died from heart failure at age 86.     ROS: All other systems reviewed and are otherwise negative except as noted in History of Present Illness.  EKGs/Labs Reviewed    EKG Interpretation Date/Time:  Monday June 14 2024 14:24:32 EDT Ventricular Rate:  70 PR Interval:  148 QRS Duration:  132 QT Interval:  412 QTC Calculation: 444 R Axis:   18  Text Interpretation: Normal sinus rhythm Right bundle branch block No previous ECGs available Confirmed by Loistine Sober [id number]) on 06/14/2024 2:28:05 PM   Labs from outside facility reviewed 06/03/2024: TSH 3.97, WBC 6.8, Hgb 14.1, HCT 42.1, PLT 291, NA 139, K4.3, BUN 18, CRT 0.73, AST 22, ALT 19.   Physical Exam    VS:  BP 122/80 (BP Location: Right Arm, Cuff Size: Normal)   Pulse 70   Ht 5' 6 (1.676 m)   Wt 159 lb 4 oz (72.2 kg)   SpO2 99%   BMI 25.70 kg/m  , BMI Body mass index is 25.7 kg/m.  GEN: Well nourished, well developed, in no acute distress. Neck: No JVD or carotid bruits. Cardiac:  RRR.  No murmur. No rubs or gallops.   Respiratory:  Respirations regular and unlabored. Clear to auscultation without rales, wheezing or rhonchi. GI: Soft, nontender, nondistended. Extremities: Radials/DP/PT 2+ and equal bilaterally. No clubbing or cyanosis. No edema.  Skin: Warm and dry, no rash. Neuro: Strength  intact.  Assessment & Plan   Palpitations Patient reports 2 episodes of palpitations feeling her heart racing and being altered by her Fit Bit that her heart rate was high. She reports heart rate ranges from 40-169 and 36-174 per her Fit Bit. This occurred at the end of July in the early morning hours. She has not had any other episodes since. She is currently wearing a Zio monitor and has a few more days for the full 14 days. EKG showed NSR with RBBB. Patient is instructed to have PCP send results of Zio to me.  - Await results of Zio ordered by PCP.   Fatigue/DOE Patient reports feeling more fatigued and dyspneic with walking. She is not as active as she used to be secondary to caring for her older brother who is in a nursing home. She denies lower extremity edema, orthopnea or PND. No chest pain.  - Schedule echo.   Coronary artery calcification CT cardiac score September 2017 with calcium score of 1.5.  Low risk nuclear stress test September 2017.  Patient denies chest pain.  - Ischemic evaluation not clinically indicated at this time.   Disposition: Echo. Return in 8-10 weeks or sooner as needed.          Signed, Barnie HERO. Jeroline Wolbert, DNP, NP-C

## 2024-06-14 ENCOUNTER — Ambulatory Visit: Attending: Student | Admitting: Student

## 2024-06-14 ENCOUNTER — Encounter: Payer: Self-pay | Admitting: Student

## 2024-06-14 VITALS — BP 122/80 | HR 70 | Ht 66.0 in | Wt 159.2 lb

## 2024-06-14 DIAGNOSIS — R5383 Other fatigue: Secondary | ICD-10-CM

## 2024-06-14 DIAGNOSIS — I2510 Atherosclerotic heart disease of native coronary artery without angina pectoris: Secondary | ICD-10-CM | POA: Diagnosis not present

## 2024-06-14 DIAGNOSIS — R0609 Other forms of dyspnea: Secondary | ICD-10-CM | POA: Diagnosis not present

## 2024-06-14 DIAGNOSIS — R002 Palpitations: Secondary | ICD-10-CM

## 2024-06-14 NOTE — Patient Instructions (Signed)
 Medication Instructions:   Your physician recommends that you continue on your current medications as directed. Please refer to the Current Medication list given to you today.   *If you need a refill on your cardiac medications before your next appointment, please call your pharmacy*  Lab Work:  No labs ordered today   If you have labs (blood work) drawn today and your tests are completely normal, you will receive your results only by: MyChart Message (if you have MyChart) OR A paper copy in the mail If you have any lab test that is abnormal or we need to change your treatment, we will call you to review the results.  Testing/Procedures:  Your physician has requested that you have an echocardiogram. Echocardiography is a painless test that uses sound waves to create images of your heart. It provides your doctor with information about the size and shape of your heart and how well your heart's chambers and valves are working.   You may receive an ultrasound enhancing agent through an IV if needed to better visualize your heart during the echo. This procedure takes approximately one hour.  There are no restrictions for this procedure.  This will take place at [address] [facility] [address], [place] [address]  Please note: We ask at that you not bring children with you during ultrasound (echo/ vascular) testing. Due to room size and safety concerns, children are not allowed in the ultrasound rooms during exams. Our front office staff cannot provide observation of children in our lobby area while testing is being conducted. An adult accompanying a patient to their appointment will only be allowed in the ultrasound room at the discretion of the ultrasound technician under special circumstances. We apologize for any inconvenience.   Follow-Up: At [facility], you and your health needs are our priority.  As part of our continuing mission to provide you with exceptional  heart care, our providers are all part of one team.  This team includes your primary Cardiologist (physician) and Advanced Practice Providers or APPs (Physician Assistants and Nurse Practitioners) who all work together to provide you with the care you need, when you need it.  Your next appointment:    8 -10 week(s)  Provider:   [name], NP

## 2024-06-23 DIAGNOSIS — R002 Palpitations: Secondary | ICD-10-CM | POA: Diagnosis not present

## 2024-06-24 DIAGNOSIS — R002 Palpitations: Secondary | ICD-10-CM | POA: Diagnosis not present

## 2024-08-03 ENCOUNTER — Ambulatory Visit: Attending: Student

## 2024-08-03 DIAGNOSIS — R002 Palpitations: Secondary | ICD-10-CM | POA: Diagnosis not present

## 2024-08-03 DIAGNOSIS — R0609 Other forms of dyspnea: Secondary | ICD-10-CM

## 2024-08-03 LAB — ECHOCARDIOGRAM COMPLETE
AR max vel: 2.23 cm2
AV Area VTI: 2.03 cm2
AV Area mean vel: 2.06 cm2
AV Mean grad: 4 mmHg
AV Peak grad: 8.3 mmHg
Ao pk vel: 1.44 m/s
Area-P 1/2: 2.76 cm2
S' Lateral: 2.77 cm

## 2024-08-04 ENCOUNTER — Ambulatory Visit: Payer: Self-pay | Admitting: Student

## 2024-08-07 NOTE — Progress Notes (Unsigned)
 Cardiology Clinic Note   Date: 08/10/2024 ID: TRISHA KEN, DOB 10/22/1946, MRN [id number]  Primary Care Provider: Vernon Velna JONELLE, MD  Primary Cardiologist:  None  Chief Complaint   Glenda Hunt is a 78 y.o. female who presents to the clinic today for follow up after testing.   Patient Profile       Past medical history significant for: Coronary artery calcification seen on CT. CT cardiac scoring 08/05/2016: Calcium score 1.5. Nuclear stress test 08/05/2016: Low risk study.  LVEF mildly decreased (45 to 54%).  Hypertensive response to exercise.  ST segment depression was noted during stress in the leads II and III beginning at 5 minutes of stress and returning to baseline after less than 1 minute of recovery.  Clinically negative, electrically positive.  Nuclear scan with normal perfusion. Dyspnea. Echo 08/03/2024: EF 60 to 65%.  No RWMA.  Normal diastolic parameters.  Normal global strain.  Normal RV size/function.  Normal PA pressure, RVSP 34.6 mmHg.  Aortic valve sclerosis without stenosis. Palpitations. 14-day ZIO 06/17/2024: HR 46 to 182 bpm, average 72 bpm.  Predominant underlying rhythm was sinus rhythm.  73 runs of SVT fastest 10 beats max rate 182 bpm, longest 13 beats average rate 107 bpm.  Rare ectopy.  In summary, patient was evaluated by Dr. Shlomo in September 2017 for palpitations.  She reported feeling her heart beating faster when laying down at night but no irregularity or skipping.  She also noted fatigue making it harder for her to walk.  She reported a family history of CAD.  She underwent CT cardiac scoring and stress testing as above.  Due to her hypertensive response to exercise she underwent 24-hour BP monitoring which showed average BP 114/67.  She was not seen again in follow-up.  Patient was evaluated by PCP on 06/03/2024 with complaints of palpitations.  She reported her smart watch alerting her at 5 AM and then again shortly after.  It appears a ZIO was placed and  lab work was done.   Patient was seen for new patient visit on 06/14/2024 for evaluation of palpitations.  She reported 2 weeks prior she had an episode in the early morning hours of receiving an alert from her Fitbit for elevated heart rate.  She did feel as though her heart was racing.  She reported no further episodes.  She endorsed increased fatigue and DOE. she was wearing an outpatient cardiac monitor at the time of her visit.  She underwent echo which demonstrated normal LV/RV function.     History of Present Illness    Today, patient is doing well. She continues to have fatigue but her sleep seems to be improving. She was under a lot of stress with caring for her brother who resides in a SNF. There had been a lot of complications regarding payment for his care but that seems to have worked out so she feels a huge weight off her shoulders. She still has DOE. She admits to not exercising for quite some time. She first stopped because of back pain but then with the stress of her brother she never went back. She has a group that wants to start walking and she plans to join them. She also has a treadmill at home. She states she does not really have palpitations. She brings the report from the Zio she wore from PCP. It demonstrated several runs of SVT. She was started on metoprolol tartrate bid. She is not thrilled about taking the  medication and wonders if she needs it if she is not having symptoms.     ROS: All other systems reviewed and are otherwise negative except as noted in History of Present Illness.   Physical Exam    VS:  BP 120/82   Pulse 71   Ht 5' 6 (1.676 m)   Wt 162 lb 9.6 oz (73.8 kg)   SpO2 98%   BMI 26.24 kg/m  , BMI Body mass index is 26.24 kg/m.  GEN: Well nourished, well developed, in no acute distress. Neck: No JVD or carotid bruits. Cardiac:  RRR.  No murmur. No rubs or gallops.   Respiratory:  Respirations regular and unlabored. Clear to auscultation without  rales, wheezing or rhonchi. GI: Soft, nontender, nondistended. Extremities: Radials/DP/PT 2+ and equal bilaterally. No clubbing or cyanosis. No edema.  Skin: Warm and dry, no rash. Neuro: Strength intact.  Assessment & Plan   Palpitations 14-day ZIO August 2025 demonstrated HR 46 to 182 bpm, average 72 bpm, 73 runs of SVT fastest 10 beats max rate 182 bpm, longest 13 beats average rate 107 bpm, rare ectopy.  Patient denies recent palpitations.  She states she wore the monitor secondary to getting alerts from her Fitbit for elevated heart rate.  She did feel as though her heart rate was racing at that time but has had no further episodes.  She states she was really having this episode at night when she was stressing.  She was started on metoprolol tartrate 25 mg twice daily by PCP.  She really is not interested in taking medications and wonders if she really needs it.  I think it is reasonable to wean her down and see if she can take the medicine as needed for sustained palpitations.  RRR on exam today. - Decrease metoprolol to 12.5 mg twice daily x 2 weeks then discontinue.  May take 12.5 mg as needed for sustained palpitations.   Fatigue/DOE Echo September 2025 demonstrated normal LV/RV function, normal diastolic parameters, normal PA pressure, aortic valve sclerosis without stenosis.  Patient reports continued fatigue and DOE.  She attributes the fatigue to poor sleep hygiene.  It has gotten some better since her stress level has decreased.  She admits to not exercising for quite some time.  She plans to join a group she knows to start walking again.  She also has a treadmill in her home. - Increase physical activity as tolerated.   Coronary artery calcification CT cardiac score September 2017 with calcium score of 1.5.  Low risk nuclear stress test September 2017.  Patient denies chest pain.  - Ischemic evaluation not clinically indicated.   Disposition: Return in 1 year or sooner as  needed.         Signed, Barnie HERO. Jerrol Helmers, DNP, NP-C

## 2024-08-09 ENCOUNTER — Encounter: Payer: Self-pay | Admitting: Emergency Medicine

## 2024-08-09 ENCOUNTER — Telehealth: Payer: Self-pay | Admitting: Emergency Medicine

## 2024-08-09 NOTE — Progress Notes (Signed)
 Letter Sent.

## 2024-08-09 NOTE — Telephone Encounter (Signed)
 Pt is returning call from 9/29. Please advise.

## 2024-08-09 NOTE — Telephone Encounter (Signed)
LM 9/29

## 2024-08-09 NOTE — Telephone Encounter (Signed)
-----   Message from [name] sent at 08/07/2024  9:18 AM EDT ----- [name],   Will you please contact PCP and get Zio report.   Thank you!  DW

## 2024-08-09 NOTE — Telephone Encounter (Signed)
 Called the patient's primary care provider's office at [phone] and spoke with Lynett.  Requested the Zio report for the patient.  Lynett took all the pertinent information and stated that the patient would need to sign a Release of Medical Records form, which can be completed online, in-person, or via fax and sent to their medical records department.  Lynett also provided fax number for our office to send that report once the Release of Medical Records has been signed by the patient.  After the conversation with Lynett, called and left a detailed message with all of the above relevant information on patient's voicemail (per DPR).  Call back number for our office provided.

## 2024-08-10 ENCOUNTER — Ambulatory Visit: Attending: Student | Admitting: Student

## 2024-08-10 ENCOUNTER — Encounter: Payer: Self-pay | Admitting: Student

## 2024-08-10 VITALS — BP 120/82 | HR 71 | Ht 66.0 in | Wt 162.6 lb

## 2024-08-10 DIAGNOSIS — R0609 Other forms of dyspnea: Secondary | ICD-10-CM | POA: Diagnosis not present

## 2024-08-10 DIAGNOSIS — R002 Palpitations: Secondary | ICD-10-CM

## 2024-08-10 DIAGNOSIS — R5383 Other fatigue: Secondary | ICD-10-CM

## 2024-08-10 DIAGNOSIS — I2510 Atherosclerotic heart disease of native coronary artery without angina pectoris: Secondary | ICD-10-CM | POA: Diagnosis not present

## 2024-08-10 NOTE — Patient Instructions (Signed)
 Medication Instructions:   Your physician recommends that you continue on your current medications as directed. Please refer to the Current Medication list given to you today.    *If you need a refill on your cardiac medications before your next appointment, please call your pharmacy*  Lab Work:  None ordered at this time   If you have labs (blood work) drawn today and your tests are completely normal, you will receive your results only by:  MyChart Message (if you have MyChart) OR  A paper copy in the mail If you have any lab test that is abnormal or we need to change your treatment, we will call you to review the results.  Testing/Procedures:  None ordered at this time   Referrals:  None ordered at this time   Follow-Up:  At [facility], you and your health needs are our priority.  As part of our continuing mission to provide you with exceptional heart care, our providers are all part of one team.  This team includes your primary Cardiologist (physician) and Advanced Practice Providers or APPs (Physician Assistants and Nurse Practitioners) who all work together to provide you with the care you need, when you need it.  Your next appointment:   1 year(s)  Provider:    [name], NP    We recommend signing up for the patient portal called MyChart.  Sign up information is provided on this After Visit Summary.  MyChart is used to connect with patients for Virtual Visits (Telemedicine).  Patients are able to view lab/test results, encounter notes, upcoming appointments, etc.  Non-urgent messages can be sent to your provider as well.   To learn more about what you can do with MyChart, go to [email or web address].   Other Instructions None ordered at this time

## 2024-08-16 DIAGNOSIS — Z961 Presence of intraocular lens: Secondary | ICD-10-CM | POA: Diagnosis not present

## 2024-08-16 DIAGNOSIS — G453 Amaurosis fugax: Secondary | ICD-10-CM | POA: Diagnosis not present

## 2024-08-16 DIAGNOSIS — H43813 Vitreous degeneration, bilateral: Secondary | ICD-10-CM | POA: Diagnosis not present

## 2024-09-14 DIAGNOSIS — I2510 Atherosclerotic heart disease of native coronary artery without angina pectoris: Secondary | ICD-10-CM | POA: Diagnosis not present

## 2024-09-14 DIAGNOSIS — Z23 Encounter for immunization: Secondary | ICD-10-CM | POA: Diagnosis not present

## 2024-09-14 DIAGNOSIS — I700 Atherosclerosis of aorta: Secondary | ICD-10-CM | POA: Diagnosis not present

## 2024-09-14 DIAGNOSIS — Z0000 Encounter for general adult medical examination without abnormal findings: Secondary | ICD-10-CM | POA: Diagnosis not present

## 2024-09-14 DIAGNOSIS — M1990 Unspecified osteoarthritis, unspecified site: Secondary | ICD-10-CM | POA: Diagnosis not present

## 2024-09-14 DIAGNOSIS — I471 Supraventricular tachycardia, unspecified: Secondary | ICD-10-CM | POA: Diagnosis not present

## 2024-09-14 DIAGNOSIS — J439 Emphysema, unspecified: Secondary | ICD-10-CM | POA: Diagnosis not present

## 2024-09-14 DIAGNOSIS — E7800 Pure hypercholesterolemia, unspecified: Secondary | ICD-10-CM | POA: Diagnosis not present

## 2024-09-14 DIAGNOSIS — K219 Gastro-esophageal reflux disease without esophagitis: Secondary | ICD-10-CM | POA: Diagnosis not present

## 2024-09-14 DIAGNOSIS — G479 Sleep disorder, unspecified: Secondary | ICD-10-CM | POA: Diagnosis not present

## 2024-10-13 DIAGNOSIS — R519 Headache, unspecified: Secondary | ICD-10-CM | POA: Diagnosis not present

## 2024-10-13 DIAGNOSIS — H6993 Unspecified Eustachian tube disorder, bilateral: Secondary | ICD-10-CM | POA: Diagnosis not present

## 2024-10-19 ENCOUNTER — Encounter (INDEPENDENT_AMBULATORY_CARE_PROVIDER_SITE_OTHER): Payer: Self-pay

## 2024-11-17 ENCOUNTER — Institutional Professional Consult (permissible substitution) (INDEPENDENT_AMBULATORY_CARE_PROVIDER_SITE_OTHER)
